# Patient Record
Sex: Male | Born: 1965 | Race: White | Hispanic: No | Marital: Married | Smoking: Never smoker
Health system: Southern US, Community
[De-identification: ages and names within clinical notes are randomized; demographics above are authoritative.]

## PROBLEM LIST (undated history)

## (undated) DIAGNOSIS — I10 Essential (primary) hypertension: Secondary | ICD-10-CM

## (undated) DIAGNOSIS — E119 Type 2 diabetes mellitus without complications: Secondary | ICD-10-CM

## (undated) DIAGNOSIS — B019 Varicella without complication: Secondary | ICD-10-CM

## (undated) DIAGNOSIS — E785 Hyperlipidemia, unspecified: Secondary | ICD-10-CM

## (undated) DIAGNOSIS — T7840XA Allergy, unspecified, initial encounter: Secondary | ICD-10-CM

## (undated) DIAGNOSIS — J45909 Unspecified asthma, uncomplicated: Secondary | ICD-10-CM

## (undated) DIAGNOSIS — N189 Chronic kidney disease, unspecified: Secondary | ICD-10-CM

## (undated) DIAGNOSIS — F419 Anxiety disorder, unspecified: Secondary | ICD-10-CM

## (undated) HISTORY — DX: Anxiety disorder, unspecified: F41.9

## (undated) HISTORY — DX: Allergy, unspecified, initial encounter: T78.40XA

## (undated) HISTORY — PX: COLONOSCOPY: SHX174

## (undated) HISTORY — PX: BACK SURGERY: SHX140

## (undated) HISTORY — DX: Unspecified asthma, uncomplicated: J45.909

## (undated) HISTORY — DX: Essential (primary) hypertension: I10

## (undated) HISTORY — DX: Chronic kidney disease, unspecified: N18.9

## (undated) HISTORY — DX: Hyperlipidemia, unspecified: E78.5

## (undated) HISTORY — DX: Varicella without complication: B01.9

## (undated) HISTORY — DX: Type 2 diabetes mellitus without complications: E11.9

---

## 2009-09-29 ENCOUNTER — Encounter
Admission: RE | Admit: 2009-09-29 | Discharge: 2009-09-29 | Payer: Self-pay | Admitting: Physical Medicine and Rehabilitation

## 2009-10-31 ENCOUNTER — Ambulatory Visit (HOSPITAL_COMMUNITY): Admission: RE | Admit: 2009-10-31 | Discharge: 2009-11-01 | Payer: Self-pay | Admitting: Orthopedic Surgery

## 2010-04-26 ENCOUNTER — Encounter: Admission: RE | Admit: 2010-04-26 | Discharge: 2010-04-26 | Payer: Self-pay | Admitting: Orthopedic Surgery

## 2010-05-24 ENCOUNTER — Inpatient Hospital Stay (HOSPITAL_COMMUNITY): Admission: RE | Admit: 2010-05-24 | Discharge: 2010-05-26 | Payer: Self-pay | Admitting: Orthopedic Surgery

## 2011-01-20 ENCOUNTER — Encounter: Payer: Self-pay | Admitting: Physical Medicine and Rehabilitation

## 2011-03-18 LAB — DIFFERENTIAL
Basophils Absolute: 0 10*3/uL (ref 0.0–0.1)
Basophils Relative: 1 % (ref 0–1)
Eosinophils Relative: 2 % (ref 0–5)
Lymphs Abs: 1.6 10*3/uL (ref 0.7–4.0)
Neutro Abs: 2.1 10*3/uL (ref 1.7–7.7)
Neutrophils Relative %: 53 % (ref 43–77)

## 2011-03-18 LAB — PROTIME-INR: INR: 1 (ref 0.00–1.49)

## 2011-03-18 LAB — COMPREHENSIVE METABOLIC PANEL
ALT: 56 U/L — ABNORMAL HIGH (ref 0–53)
AST: 33 U/L (ref 0–37)
Alkaline Phosphatase: 62 U/L (ref 39–117)
BUN: 19 mg/dL (ref 6–23)
CO2: 31 mEq/L (ref 19–32)
Chloride: 104 mEq/L (ref 96–112)
Creatinine, Ser: 1.28 mg/dL (ref 0.4–1.5)
GFR calc non Af Amer: >60 mL/min (ref >60)
Glucose, Bld: 126 mg/dL — ABNORMAL HIGH (ref 70–99)

## 2011-03-18 LAB — URINALYSIS, ROUTINE W REFLEX MICROSCOPIC
Glucose, UA: NEGATIVE mg/dL
Protein, ur: NEGATIVE mg/dL
Urobilinogen, UA: 0.2 mg/dL (ref 0.0–1.0)
pH: 6 (ref 5.0–8.0)

## 2011-03-18 LAB — CBC
Hemoglobin: 12.3 g/dL — ABNORMAL LOW (ref 13.0–17.0)
MCV: 88.9 fL (ref 78.0–100.0)
Platelets: 156 10*3/uL (ref 150–400)
RBC: 3.98 MIL/uL — ABNORMAL LOW (ref 4.22–5.81)
WBC: 4 10*3/uL (ref 4.0–10.5)

## 2011-03-18 LAB — TYPE AND SCREEN
ABO/RH(D): O POS
Antibody Screen: NEGATIVE

## 2011-04-03 LAB — COMPREHENSIVE METABOLIC PANEL
AST: 23 U/L (ref 0–37)
Alkaline Phosphatase: 63 U/L (ref 39–117)
BUN: 15 mg/dL (ref 6–23)
CO2: 28 mEq/L (ref 19–32)
Calcium: 9.4 mg/dL (ref 8.4–10.5)
Chloride: 107 mEq/L (ref 96–112)
Creatinine, Ser: 1.13 mg/dL (ref 0.4–1.5)
GFR calc Af Amer: >60 mL/min (ref >60)
GFR calc non Af Amer: >60 mL/min (ref >60)
Glucose, Bld: 118 mg/dL — ABNORMAL HIGH (ref 70–99)
Total Protein: 6.7 g/dL (ref 6.0–8.3)

## 2011-04-03 LAB — DIFFERENTIAL
Basophils Absolute: 0 10*3/uL (ref 0.0–0.1)
Basophils Relative: 0 % (ref 0–1)
Eosinophils Absolute: 0.1 10*3/uL (ref 0.0–0.7)
Eosinophils Relative: 2 % (ref 0–5)
Neutrophils Relative %: 55 % (ref 43–77)

## 2011-04-03 LAB — CBC
HCT: 38.4 % — ABNORMAL LOW (ref 39.0–52.0)
Hemoglobin: 13.1 g/dL (ref 13.0–17.0)
Platelets: 144 10*3/uL — ABNORMAL LOW (ref 150–400)
RBC: 4.28 MIL/uL (ref 4.22–5.81)
WBC: 3.6 10*3/uL — ABNORMAL LOW (ref 4.0–10.5)

## 2011-04-03 LAB — URINALYSIS, ROUTINE W REFLEX MICROSCOPIC
Bilirubin Urine: NEGATIVE
Protein, ur: NEGATIVE mg/dL
Urobilinogen, UA: 0.2 mg/dL (ref 0.0–1.0)
pH: 6 (ref 5.0–8.0)

## 2011-04-03 LAB — PROTIME-INR: INR: 0.99 (ref 0.00–1.49)

## 2011-04-03 LAB — TYPE AND SCREEN: Antibody Screen: NEGATIVE

## 2018-03-13 ENCOUNTER — Encounter: Payer: Self-pay | Admitting: Family Medicine

## 2018-03-13 ENCOUNTER — Ambulatory Visit (INDEPENDENT_AMBULATORY_CARE_PROVIDER_SITE_OTHER): Payer: 59 | Admitting: Family Medicine

## 2018-03-13 VITALS — BP 128/80 | HR 70 | Ht 69.0 in | Wt 211.0 lb

## 2018-03-13 DIAGNOSIS — E559 Vitamin D deficiency, unspecified: Secondary | ICD-10-CM

## 2018-03-13 DIAGNOSIS — R5383 Other fatigue: Secondary | ICD-10-CM | POA: Diagnosis not present

## 2018-03-13 DIAGNOSIS — I10 Essential (primary) hypertension: Secondary | ICD-10-CM | POA: Diagnosis not present

## 2018-03-13 DIAGNOSIS — Z0000 Encounter for general adult medical examination without abnormal findings: Secondary | ICD-10-CM

## 2018-03-13 DIAGNOSIS — M1990 Unspecified osteoarthritis, unspecified site: Secondary | ICD-10-CM

## 2018-03-13 DIAGNOSIS — R635 Abnormal weight gain: Secondary | ICD-10-CM | POA: Insufficient documentation

## 2018-03-13 DIAGNOSIS — E7800 Pure hypercholesterolemia, unspecified: Secondary | ICD-10-CM | POA: Insufficient documentation

## 2018-03-13 LAB — URINALYSIS, ROUTINE W REFLEX MICROSCOPIC
Bilirubin Urine: NEGATIVE
Hgb urine dipstick: NEGATIVE
Ketones, ur: NEGATIVE
Leukocytes, UA: NEGATIVE
Nitrite: NEGATIVE
Specific Gravity, Urine: 1.02 (ref 1.000–1.030)
TOTAL PROTEIN, URINE-UPE24: NEGATIVE
URINE GLUCOSE: NEGATIVE
Urobilinogen, UA: 0.2 (ref 0.0–1.0)
pH: 6 (ref 5.0–8.0)

## 2018-03-13 LAB — COMPREHENSIVE METABOLIC PANEL
ALT: 47 U/L (ref 0–53)
AST: 20 U/L (ref 0–37)
Albumin: 4.6 g/dL (ref 3.5–5.2)
Alkaline Phosphatase: 61 U/L (ref 39–117)
BUN: 21 mg/dL (ref 6–23)
CHLORIDE: 103 meq/L (ref 96–112)
CO2: 31 meq/L (ref 19–32)
Calcium: 9.8 mg/dL (ref 8.4–10.5)
Creatinine, Ser: 1.27 mg/dL (ref 0.40–1.50)
GFR: 63.36 mL/min (ref >60.00)
Glucose, Bld: 117 mg/dL — ABNORMAL HIGH (ref 70–99)
POTASSIUM: 3.7 meq/L (ref 3.5–5.1)
SODIUM: 142 meq/L (ref 135–145)
TOTAL PROTEIN: 6.9 g/dL (ref 6.0–8.3)
Total Bilirubin: 0.7 mg/dL (ref 0.2–1.2)

## 2018-03-13 LAB — CBC
HEMATOCRIT: 39.2 % (ref 39.0–52.0)
Hemoglobin: 13.7 g/dL (ref 13.0–17.0)
MCHC: 35 g/dL (ref 30.0–36.0)
MCV: 86.7 fl (ref 78.0–100.0)
Platelets: 171 10*3/uL (ref 150.0–400.0)
RBC: 4.52 Mil/uL (ref 4.22–5.81)
RDW: 12.8 % (ref 11.5–15.5)
WBC: 3.5 10*3/uL — AB (ref 4.0–10.5)

## 2018-03-13 LAB — LIPID PANEL
CHOL/HDL RATIO: 4
Cholesterol: 166 mg/dL (ref 0–200)
HDL: 38.9 mg/dL — AB (ref >39.00)
NONHDL: 127.04
Triglycerides: 203 mg/dL — ABNORMAL HIGH (ref 0.0–149.0)
VLDL: 40.6 mg/dL — AB (ref 0.0–40.0)

## 2018-03-13 LAB — VITAMIN D 25 HYDROXY (VIT D DEFICIENCY, FRACTURES): VITD: 33.82 ng/mL (ref 30.00–100.00)

## 2018-03-13 LAB — PSA: PSA: 1.06 ng/mL (ref 0.10–4.00)

## 2018-03-13 LAB — LDL CHOLESTEROL, DIRECT: Direct LDL: 101 mg/dL

## 2018-03-13 LAB — TSH: TSH: 3.33 u[IU]/mL (ref 0.35–4.50)

## 2018-03-13 MED ORDER — OLMESARTAN MEDOXOMIL-HCTZ 40-25 MG PO TABS: 1.0000 | ORAL_TABLET | Freq: Every day | ORAL | 1 refills | Status: DC

## 2018-03-13 NOTE — Progress Notes (Addendum)
Subjective:  Patient ID: Henry Hernandez, male    DOB: [birth year]  Age: 52 y.o. MRN: [id number]  CC: Establish Care   HPI Henry SomJames D Krauter presents for follow-up of his hypertension, elevated cholesterol and vitamin D deficiency.  Since our last meeting he has gained some weight and developed some fatigue and swelling in his ankles.  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  He is exercising some but not as much as he would like to.  Recent colonoscopy was good but they did bring him an internal hemorrhoid.  He is having no troubles with his medicines.  His blood pressure and cholesterol have been well controlled.  His blood glucose levels have been elevated but recent hemoglobin A1c checks have been normal.  He does not smoke drink or use illicit drugs.  He does have some urinary hesitancy and nocturia but the symptoms are intermittent and do not bother him on a consistent basis.  Outpatient Medications Prior to Visit  Medication Sig Dispense Refill  . aspirin EC 81 MG tablet Take 1 tablet by mouth daily.    . indomethacin (INDOCIN) 25 MG capsule Take 1 capsule by mouth 3 (three) times daily with meals.  1  . atorvastatin (LIPITOR) 20 MG tablet Take 1 tablet by mouth daily.  0  . metoprolol succinate (TOPROL-XL) 25 MG 24 hr tablet Take 1 tablet by mouth daily.  0  . olmesartan-hydrochlorothiazide (BENICAR HCT) 40-12.5 MG tablet Take 1 tablet by mouth daily.  0  . Vitamin D, Ergocalciferol, (DRISDOL) 50000 units CAPS capsule Take 1 capsule by mouth once a week.     No facility-administered medications prior to visit.     ROS Review of Systems  Constitutional: Positive for fatigue. Negative for chills and fever.  HENT: Positive for postnasal drip and rhinorrhea.   Eyes: Negative for photophobia and visual disturbance.  Respiratory: Negative.   Cardiovascular: Negative.   Gastrointestinal: Negative.   Endocrine: Negative for polyphagia and polyuria.    Genitourinary: Negative for decreased urine volume, hematuria and urgency.  Musculoskeletal: Negative for arthralgias.  Skin: Negative for pallor and rash.  Allergic/Immunologic: Negative for immunocompromised state.  Neurological: Negative for weakness and headaches.  Hematological: Does not bruise/bleed easily.  Psychiatric/Behavioral: Negative.     Objective:  BP 128/80 (BP Location: Left Arm, Patient Position: Sitting, Cuff Size: Normal)   Pulse 70   Ht 5\' 9"  (1.753 m)   Wt 211 lb (95.7 kg)   SpO2 99%   BMI 31.16 kg/m   BP Readings from Last 3 Encounters:  03/13/18 128/80    Wt Readings from Last 3 Encounters:  03/13/18 211 lb (95.7 kg)    Physical Exam  Constitutional: He is oriented to person, place, and time. He appears well-developed and well-nourished. No distress.  HENT:  Head: Normocephalic and atraumatic.  Right Ear: External ear normal.  Left Ear: External ear normal.  Mouth/Throat: Oropharynx is clear and moist. No oropharyngeal exudate.  Eyes: Conjunctivae are normal. Pupils are equal, round, and reactive to light. Right eye exhibits no discharge. Left eye exhibits no discharge. No scleral icterus.  Neck: Neck supple. No JVD present. No tracheal deviation present. No thyromegaly present.  Cardiovascular: Normal rate, regular rhythm and normal heart sounds.  Pulmonary/Chest: Effort normal and breath sounds normal. No stridor.  Abdominal: Soft. Bowel sounds are normal. He exhibits no distension. There is no tenderness. There is no rebound and no guarding.  Genitourinary: Rectal exam shows no external hemorrhoid, no internal hemorrhoid, no fissure, no mass, no tenderness, anal tone normal and guaiac negative stool. Prostate is not enlarged and not tender.  Musculoskeletal:       Right lower leg: He exhibits no edema.       Left lower leg: He exhibits no edema.  Lymphadenopathy:    He has no cervical adenopathy.  Neurological: He is alert and oriented to  person, place, and time.  Skin: Skin is warm and dry. No rash noted. He is not diaphoretic.  Psychiatric: He has a normal mood and affect. His behavior is normal. Thought content normal.    Lab Results  Component Value Date   WBC 3.5 (L) 03/13/2018   HGB 13.7 03/13/2018   HCT 39.2 03/13/2018   PLT 171.0 03/13/2018   GLUCOSE 117 (H) 03/13/2018   CHOL 166 03/13/2018   TRIG 203.0 (H) 03/13/2018   HDL 38.90 (L) 03/13/2018   LDLDIRECT 101.0 03/13/2018   ALT 47 03/13/2018   AST 20 03/13/2018   NA 142 03/13/2018   K 3.7 03/13/2018   CL 103 03/13/2018   CREATININE 1.27 03/13/2018   BUN 21 03/13/2018   CO2 31 03/13/2018   TSH 3.33 03/13/2018   PSA 1.06 03/13/2018   INR 1.00 05/17/2010    Dg Or Portable Spine  Result Date: 05/24/2010 Clinical Data: L5 S1 disc protrusion.  PORTABLE SPINE  Comparison: 05/24/2010 at the 1:15 p.m.  Findings: Lumbar probes are present at the L5-S1 level.  IMPRESSION:  1.  Probe localization of the L5-S1 intervertebral level. Provider: Burnis Medin  Dg Or Portable Spine  Result Date: 05/24/2010 Clinical Data: Disc protrusion, intraoperative evaluation.  PORTABLE SPINE  Comparison: Prior exams including 05/24/2010, 05/17/2010, and 04/03/2010  Findings: The blunt-tipped probe extends below the L5 spinous process, at the L5-S1 level.  IMPRESSION:  1.  L5-S1 localization. Provider: Burnis Medin  Dg Or Portable Spine  Result Date: 05/24/2010 Clinical Data: Recurrent disc protrusion at the L5-S1 can  PORTABLE SPINE  Comparison: Lumbar radiographs from 05/17/2010; lumbar MRI from 04/26/2010  Findings: Two needle type probes are present.  One is oriented between the L4 and L5 spinous processes.  The other is oriented between the L5 and S1 spinous processes.  IMPRESSION:  1.  Upper needle and L4-5; lower needle L5-S1. Provider: Burnis Medin   Assessment & Plan:   Harol was seen today for establish care.  Diagnoses and all orders for this  visit:  Essential hypertension -     CBC -     Comprehensive metabolic panel -     TSH -     Urinalysis, Routine w reflex microscopic -     Discontinue: olmesartan-hydrochlorothiazide (BENICAR HCT) 40-25 MG tablet; Take 1 tablet by mouth daily. -     metoprolol succinate (TOPROL-XL) 25 MG 24 hr tablet; Take 1 tablet (25 mg total) by mouth daily. -     olmesartan-hydrochlorothiazide (BENICAR HCT) 40-25 MG tablet; Take 1 tablet by mouth daily.  Elevated cholesterol -     Comprehensive metabolic panel -     Lipid panel -     atorvastatin (LIPITOR) 20 MG tablet; Take 1 tablet (20 mg total) by mouth daily.  Weight gain -     TSH  Fatigue, unspecified type -     CBC -     Comprehensive metabolic panel -     TSH  Vitamin D deficiency -     VITAMIN D 25  Hydroxy (Vit-D Deficiency, Fractures) -     Vitamin D, Ergocalciferol, (DRISDOL) 50000 units CAPS capsule; Take 1 capsule (50,000 Units total) by mouth once a week.  Health care maintenance -     PSA  Other orders -     LDL cholesterol, direct   I have changed Quita Skye. Hellums's atorvastatin, metoprolol succinate, and Vitamin D (Ergocalciferol). I am also having him maintain his indomethacin, aspirin EC, and olmesartan-hydrochlorothiazide.  Meds ordered this encounter  Medications  . DISCONTD: olmesartan-hydrochlorothiazide (BENICAR HCT) 40-25 MG tablet    Sig: Take 1 tablet by mouth daily.    Dispense:  100 tablet    Refill:  1  . atorvastatin (LIPITOR) 20 MG tablet    Sig: Take 1 tablet (20 mg total) by mouth daily.    Dispense:  100 tablet    Refill:  1  . metoprolol succinate (TOPROL-XL) 25 MG 24 hr tablet    Sig: Take 1 tablet (25 mg total) by mouth daily.    Dispense:  100 tablet    Refill:  1  . Vitamin D, Ergocalciferol, (DRISDOL) 50000 units CAPS capsule    Sig: Take 1 capsule (50,000 Units total) by mouth once a week.    Dispense:  30 capsule    Refill:  0  . olmesartan-hydrochlorothiazide (BENICAR HCT) 40-25 MG  tablet    Sig: Take 1 tablet by mouth daily.    Dispense:  100 tablet    Refill:  1   Will increase his olmesartan/HCTZ to 40/25 mg.  He will check his blood pressures and let me know if they run too low.  I believe this may help some with his swelling.  Discussed likely etiology for his ankle swelling to be has increased weight and standing for his job.  He is going to make a concerted effort to lose some weight and increase his exercise.  Follow-up: No Follow-up on file.  Mliss Sax, MD

## 2018-03-16 MED ORDER — OLMESARTAN MEDOXOMIL-HCTZ 40-25 MG PO TABS: 1.0000 | ORAL_TABLET | Freq: Every day | ORAL | 1 refills | Status: DC

## 2018-03-16 MED ORDER — VITAMIN D (ERGOCALCIFEROL) 1.25 MG (50000 UNIT) PO CAPS: 50000.0000 [IU] | ORAL_CAPSULE | ORAL | 0 refills | Status: DC

## 2018-03-16 MED ORDER — METOPROLOL SUCCINATE ER 25 MG PO TB24: 25.0000 mg | ORAL_TABLET | Freq: Every day | ORAL | 1 refills | Status: DC

## 2018-03-16 MED ORDER — ATORVASTATIN CALCIUM 20 MG PO TABS: 20.0000 mg | ORAL_TABLET | Freq: Every day | ORAL | 1 refills | Status: DC

## 2018-03-17 ENCOUNTER — Encounter: Payer: Self-pay | Admitting: Family Medicine

## 2018-07-01 ENCOUNTER — Other Ambulatory Visit: Payer: Self-pay | Admitting: Family Medicine

## 2018-07-01 DIAGNOSIS — M1990 Unspecified osteoarthritis, unspecified site: Secondary | ICD-10-CM

## 2018-09-02 ENCOUNTER — Other Ambulatory Visit: Payer: Self-pay | Admitting: Family Medicine

## 2018-09-02 DIAGNOSIS — I10 Essential (primary) hypertension: Secondary | ICD-10-CM

## 2018-09-02 DIAGNOSIS — E7800 Pure hypercholesterolemia, unspecified: Secondary | ICD-10-CM

## 2018-11-30 ENCOUNTER — Encounter: Payer: Self-pay | Admitting: Family Medicine

## 2018-11-30 ENCOUNTER — Ambulatory Visit (INDEPENDENT_AMBULATORY_CARE_PROVIDER_SITE_OTHER): Payer: 59 | Admitting: Family Medicine

## 2018-11-30 VITALS — BP 120/70 | HR 98 | Temp 99.5°F | Ht 69.0 in | Wt 209.2 lb

## 2018-11-30 DIAGNOSIS — M7021 Olecranon bursitis, right elbow: Secondary | ICD-10-CM | POA: Insufficient documentation

## 2018-11-30 DIAGNOSIS — I10 Essential (primary) hypertension: Secondary | ICD-10-CM | POA: Diagnosis not present

## 2018-11-30 DIAGNOSIS — E7800 Pure hypercholesterolemia, unspecified: Secondary | ICD-10-CM | POA: Diagnosis not present

## 2018-11-30 DIAGNOSIS — E559 Vitamin D deficiency, unspecified: Secondary | ICD-10-CM

## 2018-11-30 LAB — URINALYSIS, ROUTINE W REFLEX MICROSCOPIC
Bilirubin Urine: NEGATIVE
Hgb urine dipstick: NEGATIVE
Ketones, ur: NEGATIVE
Leukocytes, UA: NEGATIVE
Nitrite: NEGATIVE
PH: 6 (ref 5.0–8.0)
SPECIFIC GRAVITY, URINE: 1.015 (ref 1.000–1.030)
Total Protein, Urine: NEGATIVE
URINE GLUCOSE: NEGATIVE
UROBILINOGEN UA: 0.2 (ref 0.0–1.0)

## 2018-11-30 LAB — COMPREHENSIVE METABOLIC PANEL
ALT: 42 U/L (ref 0–53)
AST: 17 U/L (ref 0–37)
Albumin: 4.6 g/dL (ref 3.5–5.2)
Alkaline Phosphatase: 68 U/L (ref 39–117)
BILIRUBIN TOTAL: 0.9 mg/dL (ref 0.2–1.2)
BUN: 17 mg/dL (ref 6–23)
CALCIUM: 9.4 mg/dL (ref 8.4–10.5)
CHLORIDE: 100 meq/L (ref 96–112)
CO2: 31 mEq/L (ref 19–32)
Creatinine, Ser: 1.24 mg/dL (ref 0.40–1.50)
GFR: 64.95 mL/min (ref >60.00)
Glucose, Bld: 116 mg/dL — ABNORMAL HIGH (ref 70–99)
POTASSIUM: 4 meq/L (ref 3.5–5.1)
Sodium: 141 mEq/L (ref 135–145)
Total Protein: 7.4 g/dL (ref 6.0–8.3)

## 2018-11-30 LAB — LIPID PANEL
CHOLESTEROL: 154 mg/dL (ref 0–200)
HDL: 34.2 mg/dL — AB (ref >39.00)
LDL Cholesterol: 85 mg/dL (ref 0–99)
NonHDL: 119.78
TRIGLYCERIDES: 175 mg/dL — AB (ref 0.0–149.0)
Total CHOL/HDL Ratio: 5
VLDL: 35 mg/dL (ref 0.0–40.0)

## 2018-11-30 LAB — CBC
HEMATOCRIT: 36.8 % — AB (ref 39.0–52.0)
HEMOGLOBIN: 12.8 g/dL — AB (ref 13.0–17.0)
MCHC: 34.9 g/dL (ref 30.0–36.0)
MCV: 86.3 fl (ref 78.0–100.0)
PLATELETS: 199 10*3/uL (ref 150.0–400.0)
RBC: 4.26 Mil/uL (ref 4.22–5.81)
RDW: 12.9 % (ref 11.5–15.5)
WBC: 5.4 10*3/uL (ref 4.0–10.5)

## 2018-11-30 LAB — URIC ACID: URIC ACID, SERUM: 8.8 mg/dL — AB (ref 4.0–7.8)

## 2018-11-30 LAB — VITAMIN D 25 HYDROXY (VIT D DEFICIENCY, FRACTURES): VITD: 44.57 ng/mL (ref 30.00–100.00)

## 2018-11-30 LAB — LDL CHOLESTEROL, DIRECT: Direct LDL: 92 mg/dL

## 2018-11-30 MED ORDER — OLMESARTAN MEDOXOMIL-HCTZ 40-25 MG PO TABS: 1.0000 | ORAL_TABLET | Freq: Every day | ORAL | 1 refills | Status: DC

## 2018-11-30 MED ORDER — MELOXICAM 15 MG PO TABS: 15.0000 mg | ORAL_TABLET | Freq: Every day | ORAL | 0 refills | Status: DC

## 2018-11-30 MED ORDER — VITAMIN D (ERGOCALCIFEROL) 1.25 MG (50000 UNIT) PO CAPS: 50000.0000 [IU] | ORAL_CAPSULE | ORAL | 0 refills | Status: DC

## 2018-11-30 MED ORDER — ATORVASTATIN CALCIUM 20 MG PO TABS: 20.0000 mg | ORAL_TABLET | Freq: Every day | ORAL | 1 refills | Status: DC

## 2018-11-30 MED ORDER — METOPROLOL SUCCINATE ER 25 MG PO TB24: 25.0000 mg | ORAL_TABLET | Freq: Every day | ORAL | 1 refills | Status: DC

## 2018-11-30 NOTE — Progress Notes (Signed)
Established Patient Office Visit  Subjective:  Patient ID: Henry Hernandez, male    DOB: May 02, 1966  Age: 52 y.o. MRN: [id number]  CC:  Chief Complaint  Patient presents with  . Elbow Pain    HPI Henry Hernandez presents for evaluation of redness and swelling of his right elbow. It is painful and the pain goes down to his hand, there is also some redness on his arm. He had a gout attack a long time ago.  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  There is been no injury per se to his elbow.  He does have a history of gout.  He had taken Indocin in the past for his gout and that upset his stomach he has no history of diabetes.  There is been no fever or chills with this.  Blood pressure has been well controlled with the current metoprolol.  He is having no issues with these medicines.  He is having no issues taking atorvastatin.  Past Medical History:  Diagnosis Date  . Allergy   . Chicken pox   . Hyperlipidemia   . Hypertension     Past Surgical History:  Procedure Laterality Date  . BACK SURGERY      Family History  Problem Relation Age of Onset  . Arthritis Mother   . COPD Mother   . Diabetes Mother   . Heart attack Mother   . Heart disease Mother   . Hyperlipidemia Mother   . Hypertension Mother   . Kidney disease Mother   . Stroke Mother   . Hypertension Father   . Hypertension Sister   . Hypertension Brother   . Cancer Maternal Grandmother   . Hypertension Maternal Grandmother     Social History   Socioeconomic History  . Marital status: Married    Spouse name: Not on file  . Number of children: Not on file  . Years of education: Not on file  . Highest education level: Not on file  Occupational History  . Not on file  Social Needs  . Financial resource strain: Not on file  . Food insecurity:    Worry: Not on file    Inability: Not on file  . Transportation needs:    Medical: Not on file    Non-medical: Not on file  Tobacco Use  .  Smoking status: Never Smoker  . Smokeless tobacco: Never Used  Substance and Sexual Activity  . Alcohol use: Not on file  . Drug use: Not on file  . Sexual activity: Not on file  Lifestyle  . Physical activity:    Days per week: Not on file    Minutes per session: Not on file  . Stress: Not on file  Relationships  . Social connections:    Talks on phone: Not on file    Gets together: Not on file    Attends religious service: Not on file    Active member of club or organization: Not on file    Attends meetings of clubs or organizations: Not on file    Relationship status: Not on file  . Intimate partner violence:    Fear of current or ex partner: Not on file    Emotionally abused: Not on file    Physically abused: Not on file    Forced sexual activity: Not on file  Other Topics Concern  . Not on file  Social History Narrative  . Not on file    Outpatient  Medications Prior to Visit  Medication Sig Dispense Refill  . aspirin EC 81 MG tablet Take 1 tablet by mouth daily.    [name]. atorvastatin (LIPITOR) 20 MG tablet TAKE 1 TABLET BY MOUTH EVERY DAY 90 tablet 1  . indomethacin (INDOCIN) 25 MG capsule Take one only as needed with food every 8 hours for joint pains. 90 capsule 1  . metoprolol succinate (TOPROL-XL) 25 MG 24 hr tablet Take 1 tablet (25 mg total) by mouth daily. 100 tablet 1  . olmesartan-hydrochlorothiazide (BENICAR HCT) 40-25 MG tablet TAKE 1 TABLET BY MOUTH EVERY DAY 90 tablet 1  . Vitamin D, Ergocalciferol, (DRISDOL) 50000 units CAPS capsule Take 1 capsule (50,000 Units total) by mouth once a week. 30 capsule 0   No facility-administered medications prior to visit.     No Known Allergies  ROS Review of Systems  Constitutional: Negative.   HENT: Negative.   Eyes: Negative for photophobia and visual disturbance.  Respiratory: Negative.   Cardiovascular: Negative.   Gastrointestinal: Negative.   Endocrine: Negative for polyphagia and polyuria.  Musculoskeletal:  Positive for arthralgias and joint swelling.  Skin: Positive for color change. Negative for wound.  Allergic/Immunologic: Negative for immunocompromised state.  Neurological: Negative for weakness and numbness.  Hematological: Does not bruise/bleed easily.  Psychiatric/Behavioral: Negative.       Objective:    Physical Exam  Constitutional: He is oriented to person, place, and time. He appears well-developed and well-nourished. No distress.  HENT:  Head: Normocephalic and atraumatic.  Right Ear: External ear normal.  Left Ear: External ear normal.  Eyes: Right eye exhibits no discharge. Left eye exhibits no discharge. No scleral icterus.  Neck: No JVD present. No tracheal deviation present.  Pulmonary/Chest: Effort normal.  Musculoskeletal:       Right elbow: He exhibits decreased range of motion and swelling. He exhibits no effusion and no deformity. Tenderness found. Olecranon process tenderness noted.       Arms: Neurological: He is alert and oriented to person, place, and time.  Skin: Skin is warm and dry. He is not diaphoretic. There is erythema.  Psychiatric: He has a normal mood and affect. His behavior is normal.    BP 120/70   Pulse 98   Temp 99.5 F (37.5 C) (Oral)   Ht 5\' 9"  (1.753 m)   Wt 209 lb 4 oz (94.9 kg)   SpO2 99%   BMI 30.90 kg/m  Wt Readings from Last 3 Encounters:  11/30/18 209 lb 4 oz (94.9 kg)  03/13/18 211 lb (95.7 kg)   BP Readings from Last 3 Encounters:  11/30/18 120/70  03/13/18 128/80   Health Maintenance Due  Topic Date Due  . HIV Screening  06/15/1981  . TETANUS/TDAP  06/15/1985  . INFLUENZA VACCINE  07/30/2018    There are no preventive care reminders to display for this patient.  Lab Results  Component Value Date   TSH 3.33 03/13/2018   Lab Results  Component Value Date   WBC 3.5 (L) 03/13/2018   HGB 13.7 03/13/2018   HCT 39.2 03/13/2018   MCV 86.7 03/13/2018   PLT 171.0 03/13/2018   Lab Results  Component Value Date    NA 142 03/13/2018   K 3.7 03/13/2018   CO2 31 03/13/2018   GLUCOSE 117 (H) 03/13/2018   BUN 21 03/13/2018   CREATININE 1.27 03/13/2018   BILITOT 0.7 03/13/2018   ALKPHOS 61 03/13/2018   AST 20 03/13/2018   ALT 47 03/13/2018  PROT 6.9 03/13/2018   ALBUMIN 4.6 03/13/2018   CALCIUM 9.8 03/13/2018   GFR 63.36 03/13/2018   Lab Results  Component Value Date   CHOL 166 03/13/2018   Lab Results  Component Value Date   HDL 38.90 (L) 03/13/2018   No results found for: [facility] Lab Results  Component Value Date   TRIG 203.0 (H) 03/13/2018   Lab Results  Component Value Date   CHOLHDL 4 03/13/2018   No results found for: HGBA1C    Assessment & Plan:   Problem List Items Addressed This Visit      Cardiovascular and Mediastinum   Essential hypertension - Primary   Relevant Medications   atorvastatin (LIPITOR) 20 MG tablet   metoprolol succinate (TOPROL-XL) 25 MG 24 hr tablet   olmesartan-hydrochlorothiazide (BENICAR HCT) 40-25 MG tablet   Other Relevant Orders   CBC   Comprehensive metabolic panel   Urinalysis, Routine w reflex microscopic     Musculoskeletal and Integument   Olecranon bursitis of right elbow   Relevant Medications   meloxicam (MOBIC) 15 MG tablet   Other Relevant Orders   CBC   Uric acid     Other   Elevated cholesterol   Relevant Medications   atorvastatin (LIPITOR) 20 MG tablet   metoprolol succinate (TOPROL-XL) 25 MG 24 hr tablet   olmesartan-hydrochlorothiazide (BENICAR HCT) 40-25 MG tablet   Other Relevant Orders   Comprehensive metabolic panel   LDL cholesterol, direct   Lipid panel   Vitamin D deficiency   Relevant Medications   Vitamin D, Ergocalciferol, (DRISDOL) 1.25 MG (50000 UT) CAPS capsule   Other Relevant Orders   VITAMIN D 25 Hydroxy (Vit-D Deficiency, Fractures)      Meds ordered this encounter  Medications  . atorvastatin (LIPITOR) 20 MG tablet    Sig: Take 1 tablet (20 mg total) by mouth daily.    Dispense:  90  tablet    Refill:  1    Due for follow up this month  . metoprolol succinate (TOPROL-XL) 25 MG 24 hr tablet    Sig: Take 1 tablet (25 mg total) by mouth daily.    Dispense:  100 tablet    Refill:  1  . olmesartan-hydrochlorothiazide (BENICAR HCT) 40-25 MG tablet    Sig: Take 1 tablet by mouth daily.    Dispense:  90 tablet    Refill:  1    Due for follow up this month  . Vitamin D, Ergocalciferol, (DRISDOL) 1.25 MG (50000 UT) CAPS capsule    Sig: Take 1 capsule (50,000 Units total) by mouth once a week.    Dispense:  30 capsule    Refill:  0  . meloxicam (MOBIC) 15 MG tablet    Sig: Take 1 tablet (15 mg total) by mouth daily. For 10 days and then as needed with food.    Dispense:  30 tablet    Refill:  0   Patient was given anticipatory guidance on olecranon bursitis.  He will apply a neoprene sleeve and take meloxicam for 10 days.  He will follow-up if it does not resolve with this treatment. Follow-up: Return in about 6 months (around 06/01/2019), or if symptoms worsen or fail to improve.

## 2018-11-30 NOTE — Patient Instructions (Signed)
Elbow Bursitis Elbow bursitis is inflammation of the fluid-filled sac (bursa) between the tip of your elbow bone (olecranon) and your skin. Elbow bursitis may also be called olecranon bursitis. Normally, the olecranon bursa has only a small amount of fluid in it to cushion and protect your elbow bone. Elbow bursitis causes fluid to build up inside the bursa. Over time, this swelling and inflammation can cause pain when you bend or lean on your elbow. What are the causes? Elbow bursitis may be caused by:  Elbow injury (acute trauma).  Leaning on hard surfaces for long periods of time.  Infection from an injury that breaks the skin near your elbow.  A bone growth (spur) that forms at the tip of your elbow.  A medical condition that causes inflammation in your body, such as gout or rheumatoid arthritis.  The cause may also be unknown. What are the signs or symptoms? The first sign of elbow bursitis is usually swelling over the tip of your elbow. This can grow to be the size of a golf ball. This may start suddenly or develop gradually. You may also have:  Pain when bending or leaning on your elbow.  Restricted movement of your elbow.  If your bursitis is caused by an infection, symptoms may also include:  Redness, warmth, and tenderness of the elbow.  Drainage of pus from the swollen area over your elbow, if the skin breaks open.  How is this diagnosed? Your health care provider may be able to diagnose elbow bursitis based on your signs and symptoms, especially if you have recently been injured. Your health care provider will also do a physical exam. This may include:  X-rays to look for a bone spur or a bone fracture.  Draining fluid from the bursa to test it for infection.  Blood tests to rule out gout or rheumatoid arthritis.  How is this treated? Treatment for elbow bursitis depends on the cause. Treatment may include:  Medicines. These may include: ? Over-the-counter  medicines to relieve pain and inflammation. ? Antibiotic medicines to fight infection. ? Injections of anti-inflammatory medicines (steroids).  Wrapping your elbow with a bandage.  Draining fluid from the bursa.  Wearing elbow pads.  If your bursitis does not get better with treatment, surgery may be needed to remove the bursa. Follow these instructions at home:  Take medicines only as directed by your health care provider.  If you were prescribed an antibiotic medicine, finish all of it even if you start to feel better.  If your bursitis is caused by an injury, rest your elbow and wear your bandage as directed by your health care provider. You may alsoapply ice to the injured area as directed by your health care provider: ? Put ice in a plastic bag. ? Place a towel between your skin and the bag. ? Leave the ice on for 20 minutes, 2-3 times per day.  Avoid any activities that cause elbow pain.  Use elbow pads or elbow wraps to cushion your elbow. Contact a health care provider if:  You have a fever.  Your symptoms do not get better with treatment.  Your pain or swelling gets worse.  Your elbow pain or swelling goes away and then returns.  You have drainage of pus from the swollen area over your elbow. This information is not intended to replace advice given to you by your health care provider. Make sure you discuss any questions you have with your health care provider. Document Released:   01/15/2007 Document Revised: 05/23/2016 Document Reviewed: 08/24/2014 Elsevier Interactive Patient Education  2018 [employer]  

## 2018-12-07 ENCOUNTER — Telehealth: Payer: Self-pay | Admitting: Family Medicine

## 2018-12-07 NOTE — Telephone Encounter (Signed)
Copied from CRM [id number]. Topic: General - Other >> Dec 07, 2018  4:34 PM Lynne LoganHudson, Caryn D wrote: Reason for CRM: Pt called to get lab results. NT unavailable. Please advise

## 2018-12-08 NOTE — Telephone Encounter (Signed)
Charted in result notes. 

## 2018-12-15 ENCOUNTER — Ambulatory Visit (INDEPENDENT_AMBULATORY_CARE_PROVIDER_SITE_OTHER): Payer: 59 | Admitting: Family Medicine

## 2018-12-15 ENCOUNTER — Encounter: Payer: Self-pay | Admitting: Family Medicine

## 2018-12-15 VITALS — BP 124/76 | HR 88 | Ht 69.0 in

## 2018-12-15 DIAGNOSIS — M7021 Olecranon bursitis, right elbow: Secondary | ICD-10-CM | POA: Diagnosis not present

## 2018-12-15 DIAGNOSIS — J4531 Mild persistent asthma with (acute) exacerbation: Secondary | ICD-10-CM | POA: Diagnosis not present

## 2018-12-15 MED ORDER — HYDROCODONE-HOMATROPINE 5-1.5 MG/5ML PO SYRP
5.0000 mL | ORAL_SOLUTION | Freq: Three times a day (TID) | ORAL | 0 refills | Status: DC | PRN
Start: 2018-12-15 — End: 2019-05-25

## 2018-12-15 MED ORDER — PREDNISONE 20 MG PO TABS: 20.0000 mg | ORAL_TABLET | Freq: Two times a day (BID) | ORAL | 0 refills | Status: AC

## 2018-12-15 NOTE — Progress Notes (Signed)
Established Patient Office Visit  Subjective:  Patient ID: Henry Hernandez, male    DOB: 08-16-1966  Age: 52 y.o. MRN: [id number]  CC: No chief complaint on file.   HPI Henry SomJames D Morning presents for evaluation of a cough that persists after URI that is finally seem to have cleared for the most part.  He is no longer running fevers or chills but has been experiencing a dry cough that seems to be worse at night.  There is been some tightness in the chest with the cough but no frank wheezing.  He has no history of asthma or tobacco use.  He has been having discomfort in the left upper chest area.  He experiences this with deep inspiration and expiration.  There is no exertional component to this.  It is not associated with shortness of breath diaphoresis or nausea or vomiting.  He has tried multiple over-the-counter cough and cold remedies.  There is no facial pressure or teeth pain or purulent rhinorrhea.  Right elbow seems to be improving with the meloxicam.  There seems to be some residual decreased range of motion.  Past Medical History:  Diagnosis Date  . Allergy   . Chicken pox   . Hyperlipidemia   . Hypertension     Past Surgical History:  Procedure Laterality Date  . BACK SURGERY      Family History  Problem Relation Age of Onset  . Arthritis Mother   . COPD Mother   . Diabetes Mother   . Heart attack Mother   . Heart disease Mother   . Hyperlipidemia Mother   . Hypertension Mother   . Kidney disease Mother   . Stroke Mother   . Hypertension Father   . Hypertension Sister   . Hypertension Brother   . Cancer Maternal Grandmother   . Hypertension Maternal Grandmother     Social History   Socioeconomic History  . Marital status: Married    Spouse name: Not on file  . Number of children: Not on file  . Years of education: Not on file  . Highest education level: Not on file  Occupational History  . Not on file  Social Needs  . Financial resource strain: Not on file    . Food insecurity:    Worry: Not on file    Inability: Not on file  . Transportation needs:    Medical: Not on file    Non-medical: Not on file  Tobacco Use  . Smoking status: Never Smoker  . Smokeless tobacco: Never Used  Substance and Sexual Activity  . Alcohol use: Not on file  . Drug use: Not on file  . Sexual activity: Not on file  Lifestyle  . Physical activity:    Days per week: Not on file    Minutes per session: Not on file  . Stress: Not on file  Relationships  . Social connections:    Talks on phone: Not on file    Gets together: Not on file    Attends religious service: Not on file    Active member of club or organization: Not on file    Attends meetings of clubs or organizations: Not on file    Relationship status: Not on file  . Intimate partner violence:    Fear of current or ex partner: Not on file    Emotionally abused: Not on file    Physically abused: Not on file    Forced sexual activity: Not on file  Other Topics Concern  . Not on file  Social History Narrative  . Not on file    Outpatient Medications Prior to Visit  Medication Sig Dispense Refill  . aspirin EC 81 MG tablet Take 1 tablet by mouth daily.    [name] atorvastatin (LIPITOR) 20 MG tablet Take 1 tablet (20 mg total) by mouth daily. 90 tablet 1  . meloxicam (MOBIC) 15 MG tablet Take 1 tablet (15 mg total) by mouth daily. For 10 days and then as needed with food. 30 tablet 0  . metoprolol succinate (TOPROL-XL) 25 MG 24 hr tablet Take 1 tablet (25 mg total) by mouth daily. 100 tablet 1  . olmesartan-hydrochlorothiazide (BENICAR HCT) 40-25 MG tablet Take 1 tablet by mouth daily. 90 tablet 1  . Vitamin D, Ergocalciferol, (DRISDOL) 1.25 MG (50000 UT) CAPS capsule Take 1 capsule (50,000 Units total) by mouth once a week. 30 capsule 0   No facility-administered medications prior to visit.     No Known Allergies  ROS Review of Systems  Constitutional: Negative for diaphoresis, fatigue, fever and  unexpected weight change.  HENT: Positive for postnasal drip. Negative for congestion, dental problem, rhinorrhea, sinus pressure and sinus pain.   Eyes: Negative for photophobia and visual disturbance.  Respiratory: Positive for cough. Negative for chest tightness, shortness of breath and wheezing.   Cardiovascular: Positive for chest pain. Negative for leg swelling.  Gastrointestinal: Negative.   Musculoskeletal: Positive for arthralgias. Negative for joint swelling and myalgias.  Skin: Negative for color change, pallor and rash.  Allergic/Immunologic: Negative for immunocompromised state.  Neurological: Negative for light-headedness and headaches.  Hematological: Does not bruise/bleed easily.  Psychiatric/Behavioral: Negative.       Objective:    Physical Exam  Constitutional: He is oriented to person, place, and time. He appears well-developed and well-nourished. No distress.  HENT:  Head: Normocephalic and atraumatic.  Right Ear: External ear normal.  Left Ear: External ear normal.  Mouth/Throat: Oropharynx is clear and moist. No oropharyngeal exudate.  Eyes: Pupils are equal, round, and reactive to light. Conjunctivae are normal. Right eye exhibits no discharge. Left eye exhibits no discharge. No scleral icterus.  Neck: Neck supple. No JVD present. No tracheal deviation present. No thyromegaly present.  Cardiovascular: Normal rate, regular rhythm and normal heart sounds.  Pulmonary/Chest: Effort normal and breath sounds normal. No stridor. No respiratory distress. He has no wheezes. He has no rales.  Abdominal: Bowel sounds are normal.  Musculoskeletal:       Arms:  Lymphadenopathy:    He has no cervical adenopathy.  Neurological: He is alert and oriented to person, place, and time.  Skin: Skin is warm and dry. He is not diaphoretic.  Psychiatric: He has a normal mood and affect.    BP 124/76   Pulse 88   Ht 5\' 9"  (1.753 m)   SpO2 99%   BMI 30.90 kg/m  Wt Readings  from Last 3 Encounters:  11/30/18 209 lb 4 oz (94.9 kg)  03/13/18 211 lb (95.7 kg)   BP Readings from Last 3 Encounters:  12/15/18 124/76  11/30/18 120/70  03/13/18 128/80   Guideline developer:  UpToDate (see UpToDate for funding source) Date Released: June 2014  Health Maintenance Due  Topic Date Due  . HIV Screening  06/15/1981  . TETANUS/TDAP  06/15/1985  . INFLUENZA VACCINE  07/30/2018    There are no preventive care reminders to display for this patient.  Lab Results  Component Value Date   TSH  3.33 03/13/2018   Lab Results  Component Value Date   WBC 5.4 11/30/2018   HGB 12.8 (L) 11/30/2018   HCT 36.8 (L) 11/30/2018   MCV 86.3 11/30/2018   PLT 199.0 11/30/2018   Lab Results  Component Value Date   NA 141 11/30/2018   K 4.0 11/30/2018   CO2 31 11/30/2018   GLUCOSE 116 (H) 11/30/2018   BUN 17 11/30/2018   CREATININE 1.24 11/30/2018   BILITOT 0.9 11/30/2018   ALKPHOS 68 11/30/2018   AST 17 11/30/2018   ALT 42 11/30/2018   PROT 7.4 11/30/2018   ALBUMIN 4.6 11/30/2018   CALCIUM 9.4 11/30/2018   GFR 64.95 11/30/2018   Lab Results  Component Value Date   CHOL 154 11/30/2018   Lab Results  Component Value Date   HDL 34.20 (L) 11/30/2018   Lab Results  Component Value Date   LDLCALC 85 11/30/2018   Lab Results  Component Value Date   TRIG 175.0 (H) 11/30/2018   Lab Results  Component Value Date   CHOLHDL 5 11/30/2018   No results found for: HGBA1C    Assessment & Plan:   Problem List Items Addressed This Visit      Musculoskeletal and Integument   Olecranon bursitis of right elbow    Other Visit Diagnoses    Mild persistent reactive airway disease with acute exacerbation    -  Primary   Relevant Medications   predniSONE (DELTASONE) 20 MG tablet   HYDROcodone-homatropine (HYCODAN) 5-1.5 MG/5ML syrup      Meds ordered this encounter  Medications  . predniSONE (DELTASONE) 20 MG tablet    Sig: Take 1 tablet (20 mg total) by mouth 2  (two) times daily with a meal for 7 days.    Dispense:  14 tablet    Refill:  0  . HYDROcodone-homatropine (HYCODAN) 5-1.5 MG/5ML syrup    Sig: Take 5 mLs by mouth every 8 (eight) hours as needed for cough.    Dispense:  120 mL    Refill:  0    Follow-up: Return if symptoms worsen or fail to improve.  Patient will use cough syrup mostly at night.  He will use the prednisone 20 mg twice daily in lieu of the last couple meloxicam.

## 2019-01-14 ENCOUNTER — Other Ambulatory Visit: Payer: Self-pay | Admitting: Family Medicine

## 2019-01-14 DIAGNOSIS — M7021 Olecranon bursitis, right elbow: Secondary | ICD-10-CM

## 2019-02-12 ENCOUNTER — Other Ambulatory Visit: Payer: Self-pay | Admitting: Family Medicine

## 2019-02-12 DIAGNOSIS — M7021 Olecranon bursitis, right elbow: Secondary | ICD-10-CM

## 2019-03-05 ENCOUNTER — Other Ambulatory Visit: Payer: Self-pay | Admitting: Family Medicine

## 2019-03-05 DIAGNOSIS — E559 Vitamin D deficiency, unspecified: Secondary | ICD-10-CM

## 2019-03-29 ENCOUNTER — Other Ambulatory Visit: Payer: Self-pay | Admitting: Family Medicine

## 2019-03-29 DIAGNOSIS — M7021 Olecranon bursitis, right elbow: Secondary | ICD-10-CM

## 2019-04-23 ENCOUNTER — Other Ambulatory Visit: Payer: Self-pay | Admitting: Family Medicine

## 2019-04-23 DIAGNOSIS — M7021 Olecranon bursitis, right elbow: Secondary | ICD-10-CM

## 2019-04-23 NOTE — Telephone Encounter (Signed)
Dr. Doreene Burke please advise last OV was 12/15/2018 last refill #30 no refills 03/29/2019

## 2019-05-25 ENCOUNTER — Encounter: Payer: 59 | Admitting: Family Medicine

## 2019-05-25 ENCOUNTER — Encounter: Payer: Self-pay | Admitting: Family Medicine

## 2019-05-25 ENCOUNTER — Ambulatory Visit (INDEPENDENT_AMBULATORY_CARE_PROVIDER_SITE_OTHER): Payer: 59 | Admitting: Family Medicine

## 2019-05-25 VITALS — BP 128/80 | HR 79 | Temp 98.5°F | Ht 69.0 in | Wt 213.5 lb

## 2019-05-25 DIAGNOSIS — E538 Deficiency of other specified B group vitamins: Secondary | ICD-10-CM

## 2019-05-25 DIAGNOSIS — E7800 Pure hypercholesterolemia, unspecified: Secondary | ICD-10-CM

## 2019-05-25 DIAGNOSIS — D649 Anemia, unspecified: Secondary | ICD-10-CM | POA: Diagnosis not present

## 2019-05-25 DIAGNOSIS — E669 Obesity, unspecified: Secondary | ICD-10-CM

## 2019-05-25 DIAGNOSIS — E559 Vitamin D deficiency, unspecified: Secondary | ICD-10-CM

## 2019-05-25 DIAGNOSIS — Z1159 Encounter for screening for other viral diseases: Secondary | ICD-10-CM | POA: Insufficient documentation

## 2019-05-25 DIAGNOSIS — R5383 Other fatigue: Secondary | ICD-10-CM | POA: Diagnosis not present

## 2019-05-25 DIAGNOSIS — E876 Hypokalemia: Secondary | ICD-10-CM

## 2019-05-25 DIAGNOSIS — R21 Rash and other nonspecific skin eruption: Secondary | ICD-10-CM

## 2019-05-25 DIAGNOSIS — R7309 Other abnormal glucose: Secondary | ICD-10-CM

## 2019-05-25 DIAGNOSIS — Z0000 Encounter for general adult medical examination without abnormal findings: Secondary | ICD-10-CM | POA: Diagnosis not present

## 2019-05-25 DIAGNOSIS — Z23 Encounter for immunization: Secondary | ICD-10-CM | POA: Diagnosis not present

## 2019-05-25 DIAGNOSIS — I10 Essential (primary) hypertension: Secondary | ICD-10-CM

## 2019-05-25 DIAGNOSIS — R635 Abnormal weight gain: Secondary | ICD-10-CM

## 2019-05-25 DIAGNOSIS — Z125 Encounter for screening for malignant neoplasm of prostate: Secondary | ICD-10-CM | POA: Insufficient documentation

## 2019-05-25 LAB — LIPID PANEL
Cholesterol: 167 mg/dL (ref 0–200)
HDL: 31 mg/dL — ABNORMAL LOW (ref >39.00)
NonHDL: 135.96
Total CHOL/HDL Ratio: 5
Triglycerides: 275 mg/dL — ABNORMAL HIGH (ref 0.0–149.0)
VLDL: 55 mg/dL — ABNORMAL HIGH (ref 0.0–40.0)

## 2019-05-25 LAB — URINALYSIS, ROUTINE W REFLEX MICROSCOPIC
Bilirubin Urine: NEGATIVE
Hgb urine dipstick: NEGATIVE
Ketones, ur: NEGATIVE
Leukocytes,Ua: NEGATIVE
Nitrite: NEGATIVE
Specific Gravity, Urine: 1.02 (ref 1.000–1.030)
Total Protein, Urine: NEGATIVE
Urine Glucose: NEGATIVE
Urobilinogen, UA: 0.2 (ref 0.0–1.0)
pH: 6 (ref 5.0–8.0)

## 2019-05-25 LAB — CBC
HCT: 40.3 % (ref 39.0–52.0)
Hemoglobin: 14.4 g/dL (ref 13.0–17.0)
MCHC: 35.8 g/dL (ref 30.0–36.0)
MCV: 84.6 fl (ref 78.0–100.0)
Platelets: 180 10*3/uL (ref 150.0–400.0)
RBC: 4.77 Mil/uL (ref 4.22–5.81)
RDW: 12.8 % (ref 11.5–15.5)
WBC: 4.4 10*3/uL (ref 4.0–10.5)

## 2019-05-25 LAB — COMPREHENSIVE METABOLIC PANEL
ALT: 34 U/L (ref 0–53)
AST: 18 U/L (ref 0–37)
Albumin: 4.6 g/dL (ref 3.5–5.2)
Alkaline Phosphatase: 63 U/L (ref 39–117)
BUN: 18 mg/dL (ref 6–23)
CO2: 27 mEq/L (ref 19–32)
Calcium: 9.2 mg/dL (ref 8.4–10.5)
Chloride: 101 mEq/L (ref 96–112)
Creatinine, Ser: 1.25 mg/dL (ref 0.40–1.50)
GFR: 60.43 mL/min (ref >60.00)
Glucose, Bld: 120 mg/dL — ABNORMAL HIGH (ref 70–99)
Potassium: 3.2 mEq/L — ABNORMAL LOW (ref 3.5–5.1)
Sodium: 140 mEq/L (ref 135–145)
Total Bilirubin: 0.8 mg/dL (ref 0.2–1.2)
Total Protein: 7 g/dL (ref 6.0–8.3)

## 2019-05-25 LAB — TESTOSTERONE: Testosterone: 165.5 ng/dL — ABNORMAL LOW (ref 300.00–890.00)

## 2019-05-25 LAB — VITAMIN B12: Vitamin B-12: 177 pg/mL — ABNORMAL LOW (ref 211–911)

## 2019-05-25 LAB — PSA: PSA: 1.03 ng/mL (ref 0.10–4.00)

## 2019-05-25 LAB — LDL CHOLESTEROL, DIRECT: Direct LDL: 87 mg/dL

## 2019-05-25 LAB — VITAMIN D 25 HYDROXY (VIT D DEFICIENCY, FRACTURES): VITD: 44.6 ng/mL (ref 30.00–100.00)

## 2019-05-25 MED ORDER — TRIAMCINOLONE ACETONIDE 0.5 % EX OINT: 1.0000 "application " | TOPICAL_OINTMENT | Freq: Two times a day (BID) | CUTANEOUS | 0 refills | Status: DC

## 2019-05-25 NOTE — Progress Notes (Addendum)
Established Patient Office Visit  Subjective:  Patient ID: Henry Hernandez, male    DOB: 1966-05-06  Age: 53 y.o. MRN: [id number]  CC:  Chief Complaint  Patient presents with  . Annual Exam    HPI [name] presents for routine physical and follow-up of his hypertension, elevated cholesterol and vitamin D deficiency.  Of course patient has been stressed with the pandemic.  Continues to work as a Administrator, arts.  Mostly stressed about his weight gain.  Admits to some stress eating and decreased exercise.  He feels more anxious with his weight gain and seeming lack of the ability to establish an exercise routine.  He has had a rash on his chest that he has treated with antifungals without help.  Blood pressure is been well controlled on his current regimen.  He is having no issues taking the atorvastatin.  Metoprolol controls palpitations.  He takes meloxicam on a as needed basis for occasional back pain associated with 2 prior surgeries.  Past Medical History:  Diagnosis Date  . Allergy   . Chicken pox   . Hyperlipidemia   . Hypertension     Past Surgical History:  Procedure Laterality Date  . BACK SURGERY      Family History  Problem Relation Age of Onset  . Arthritis Mother   . COPD Mother   . Diabetes Mother   . Heart attack Mother   . Heart disease Mother   . Hyperlipidemia Mother   . Hypertension Mother   . Kidney disease Mother   . Stroke Mother   . Hypertension Father   . Hypertension Sister   . Hypertension Brother   . Cancer Maternal Grandmother   . Hypertension Maternal Grandmother     Social History   Socioeconomic History  . Marital status: Married    Spouse name: Not on file  . Number of children: Not on file  . Years of education: Not on file  . Highest education level: Not on file  Occupational History  . Not on file  Social Needs  . Financial resource strain: Not on file  . Food insecurity:    Worry: Not on file    Inability: Not on file  .  Transportation needs:    Medical: Not on file    Non-medical: Not on file  Tobacco Use  . Smoking status: Never Smoker  . Smokeless tobacco: Never Used  Substance and Sexual Activity  . Alcohol use: Not on file  . Drug use: Not on file  . Sexual activity: Not on file  Lifestyle  . Physical activity:    Days per week: Not on file    Minutes per session: Not on file  . Stress: Not on file  Relationships  . Social connections:    Talks on phone: Not on file    Gets together: Not on file    Attends religious service: Not on file    Active member of club or organization: Not on file    Attends meetings of clubs or organizations: Not on file    Relationship status: Not on file  . Intimate partner violence:    Fear of current or ex partner: Not on file    Emotionally abused: Not on file    Physically abused: Not on file    Forced sexual activity: Not on file  Other Topics Concern  . Not on file  Social History Narrative  . Not on file    Outpatient Medications  Prior to Visit  Medication Sig Dispense Refill  . aspirin EC 81 MG tablet Take 1 tablet by mouth daily.    [name] atorvastatin (LIPITOR) 20 MG tablet Take 1 tablet (20 mg total) by mouth daily. 90 tablet 1  . meloxicam (MOBIC) 15 MG tablet TAKE 1 TABLET (15 MG TOTAL) BY MOUTH DAILY. FOR 10 DAYS AND THEN AS NEEDED WITH FOOD. 30 tablet 0  . metoprolol succinate (TOPROL-XL) 25 MG 24 hr tablet Take 1 tablet (25 mg total) by mouth daily. 100 tablet 1  . olmesartan-hydrochlorothiazide (BENICAR HCT) 40-25 MG tablet Take 1 tablet by mouth daily. 90 tablet 1  . Vitamin D, Ergocalciferol, (DRISDOL) 1.25 MG (50000 UT) CAPS capsule TAKE 1 CAPSULE BY MOUTH ONE TIME PER WEEK 18 capsule 0  . HYDROcodone-homatropine (HYCODAN) 5-1.5 MG/5ML syrup Take 5 mLs by mouth every 8 (eight) hours as needed for cough. 120 mL 0   No facility-administered medications prior to visit.     No Known Allergies  ROS Review of Systems  Constitutional:  Negative for diaphoresis, fatigue, fever and unexpected weight change.  HENT: Negative.   Eyes: Negative for photophobia and visual disturbance.  Respiratory: Negative.   Cardiovascular: Negative.   Gastrointestinal: Negative.   Endocrine: Negative for polyphagia and polyuria.  Genitourinary: Negative for decreased urine volume, difficulty urinating, frequency and hematuria.  Musculoskeletal: Negative for gait problem and joint swelling.  Allergic/Immunologic: Negative for immunocompromised state.  Neurological: Negative for weakness.  Hematological: Does not bruise/bleed easily.  Psychiatric/Behavioral: The patient is nervous/anxious.       Objective:    Physical Exam  Constitutional: He is oriented to person, place, and time. He appears well-developed and well-nourished. No distress.  HENT:  Head: Normocephalic and atraumatic.  Right Ear: External ear normal.  Left Ear: External ear normal.  Mouth/Throat: Oropharynx is clear and moist. No oropharyngeal exudate.  Eyes: Pupils are equal, round, and reactive to light. Conjunctivae are normal. Right eye exhibits no discharge. Left eye exhibits no discharge. No scleral icterus.  Neck: Neck supple. No JVD present. No tracheal deviation present. No thyromegaly present.  Cardiovascular: Normal rate, regular rhythm and normal heart sounds.  Pulmonary/Chest: Effort normal and breath sounds normal. No stridor.  Abdominal: Soft. Bowel sounds are normal. He exhibits no distension. There is no abdominal tenderness. There is no rebound and no guarding.  Musculoskeletal:     Lumbar back: He exhibits normal range of motion and no tenderness.       Back:  Lymphadenopathy:    He has no cervical adenopathy.  Neurological: He is alert and oriented to person, place, and time.  Skin: Skin is warm and dry. He is not diaphoretic.     Psychiatric: He has a normal mood and affect.    BP 128/80   Pulse 79   Temp 98.5 F (36.9 C) (Oral)   Ht 5'  9" (1.753 m)   Wt 213 lb 8 oz (96.8 kg)   SpO2 95%   BMI 31.53 kg/m  Wt Readings from Last 3 Encounters:  05/25/19 213 lb 8 oz (96.8 kg)  11/30/18 209 lb 4 oz (94.9 kg)  03/13/18 211 lb (95.7 kg)   BP Readings from Last 3 Encounters:  05/25/19 128/80  12/15/18 124/76  11/30/18 120/70   Guideline developer:  UpToDate (see UpToDate for funding source) Date Released: June 2014  There are no preventive care reminders to display for this patient.  There are no preventive care reminders to display for this  patient.  Lab Results  Component Value Date   TSH 3.33 03/13/2018   Lab Results  Component Value Date   WBC 4.4 05/25/2019   HGB 14.4 05/25/2019   HCT 40.3 05/25/2019   MCV 84.6 05/25/2019   PLT 180.0 05/25/2019   Lab Results  Component Value Date   NA 140 05/25/2019   K 3.3 (L) 06/01/2019   CO2 27 05/25/2019   GLUCOSE 120 (H) 05/25/2019   BUN 18 05/25/2019   CREATININE 1.25 05/25/2019   BILITOT 0.8 05/25/2019   ALKPHOS 63 05/25/2019   AST 18 05/25/2019   ALT 34 05/25/2019   PROT 7.0 05/25/2019   ALBUMIN 4.6 05/25/2019   CALCIUM 9.2 05/25/2019   GFR 60.43 05/25/2019   Lab Results  Component Value Date   CHOL 167 05/25/2019   Lab Results  Component Value Date   HDL 31.00 (L) 05/25/2019   Lab Results  Component Value Date   LDLCALC 85 11/30/2018   Lab Results  Component Value Date   TRIG 275.0 (H) 05/25/2019   Lab Results  Component Value Date   CHOLHDL 5 05/25/2019   No results found for: HGBA1C    Assessment & Plan:   Problem List Items Addressed This Visit      Cardiovascular and Mediastinum   Essential hypertension   Relevant Orders   Comprehensive metabolic panel (Completed)   Urinalysis, Routine w reflex microscopic (Completed)     Musculoskeletal and Integument   Rash   Relevant Medications   triamcinolone ointment (KENALOG) 0.5 %     Other   Elevated cholesterol   Relevant Orders   Comprehensive metabolic panel (Completed)    Lipid panel (Completed)   LDL cholesterol, direct (Completed)   Weight gain   Relevant Medications   Liraglutide -Weight Management (SAXENDA) 18 MG/3ML SOPN   Fatigue   Relevant Medications   b complex vitamins capsule   Other Relevant Orders   CBC (Completed)   Comprehensive metabolic panel (Completed)   PSA (Completed)   Testosterone (Completed)   Vitamin B12 (Completed)   Iron, TIBC and Ferritin Panel (Completed)   HIV Antibody (routine testing w rflx) (Completed)   Vitamin D deficiency   Relevant Orders   VITAMIN D 25 Hydroxy (Vit-D Deficiency, Fractures) (Completed)   Healthcare maintenance - Primary   Relevant Orders   CBC (Completed)   Comprehensive metabolic panel (Completed)   Anemia   Relevant Orders   Vitamin B12 (Completed)   Iron, TIBC and Ferritin Panel (Completed)   Hypokalemia   Relevant Medications   potassium chloride SA (K-DUR) 20 MEQ tablet   Other Relevant Orders   Potassium (Completed)   Obesity (BMI 30-39.9)   Relevant Medications   Liraglutide -Weight Management (SAXENDA) 18 MG/3ML SOPN   Elevated glucose   Relevant Medications   Liraglutide -Weight Management (SAXENDA) 18 MG/3ML SOPN   B12 deficiency   Relevant Medications   b complex vitamins capsule    Other Visit Diagnoses    Need for Tdap vaccination       Relevant Orders   Tdap vaccine greater than or equal to 7yo IM (Completed)      Meds ordered this encounter  Medications  . triamcinolone ointment (KENALOG) 0.5 %    Sig: Apply 1 application topically 2 (two) times daily. To rash on chest only.    Dispense:  30 g    Refill:  0  . b complex vitamins capsule    Sig: Take 1 capsule by mouth  daily.    Dispense:  90 capsule    Refill:  1  . Liraglutide -Weight Management (SAXENDA) 18 MG/3ML SOPN    Sig: Inject 3 mg into the skin daily for 30 days.    Dispense:  5 pen    Refill:  2  . potassium chloride SA (K-DUR) 20 MEQ tablet    Sig: Take 1 tablet (20 mEq total) by mouth  daily.    Dispense:  30 tablet    Refill:  3    Follow-up: Return in about 3 months (around 08/25/2019), or if symptoms worsen or fail to improve.   Patient was given mindfulness information to help with anxiety.  Invited him to come back if this does not help.  He will use triamcinolone on his rash and let me know if it does not resolve.  Encouraged him to work on exercise regimen into his routine and believe that this would help his sense of wellbeing and anxiety as well.  5/29 addendum: Discussed patient's blood work with him today.  He will try B complex to treat his low B12 levels.  Suggested this may be a cause of his fatigue.  Testosterone was low.  Patient with like to try to lose some weight and have it rechecked.  Agreed and supported the decision.  Believes that recent gain in weight has a lot to do with his current sense of wellbeing.  Glucose was elevated as were triglycerides.  Also believe that this these could be explained by recent weight gain.  Follow-up Saxenda.  Patient will follow-up for repeat potassium at his convenience in the next week or so.  Follow-up will be in 3 months.

## 2019-05-25 NOTE — Patient Instructions (Signed)

## 2019-05-26 LAB — IRON,TIBC AND FERRITIN PANEL
%SAT: 32 % (calc) (ref 20–48)
Ferritin: 219 ng/mL (ref 38–380)
Iron: 101 ug/dL (ref 50–180)
TIBC: 318 mcg/dL (calc) (ref 250–425)

## 2019-05-26 LAB — HIV ANTIBODY (ROUTINE TESTING W REFLEX): HIV 1&2 Ab, 4th Generation: NONREACTIVE

## 2019-05-28 ENCOUNTER — Other Ambulatory Visit: Payer: Self-pay | Admitting: Family Medicine

## 2019-05-28 ENCOUNTER — Encounter: Payer: Self-pay | Admitting: Family Medicine

## 2019-05-28 DIAGNOSIS — I10 Essential (primary) hypertension: Secondary | ICD-10-CM

## 2019-05-28 DIAGNOSIS — E669 Obesity, unspecified: Secondary | ICD-10-CM | POA: Insufficient documentation

## 2019-05-28 DIAGNOSIS — E876 Hypokalemia: Secondary | ICD-10-CM | POA: Insufficient documentation

## 2019-05-28 DIAGNOSIS — E538 Deficiency of other specified B group vitamins: Secondary | ICD-10-CM | POA: Insufficient documentation

## 2019-05-28 DIAGNOSIS — E559 Vitamin D deficiency, unspecified: Secondary | ICD-10-CM

## 2019-05-28 DIAGNOSIS — R7309 Other abnormal glucose: Secondary | ICD-10-CM | POA: Insufficient documentation

## 2019-05-28 MED ORDER — LIRAGLUTIDE -WEIGHT MANAGEMENT 18 MG/3ML ~~LOC~~ SOPN: 3.0000 mg | PEN_INJECTOR | Freq: Every day | SUBCUTANEOUS | 2 refills | Status: AC

## 2019-05-28 MED ORDER — B COMPLEX VITAMINS PO CAPS: 1.0000 | ORAL_CAPSULE | Freq: Every day | ORAL | 1 refills | Status: DC

## 2019-05-28 NOTE — Addendum Note (Signed)
Addended by: Andrez Grime on: 05/28/2019 10:33 AM   Modules accepted: Orders, Level of Service

## 2019-05-31 ENCOUNTER — Other Ambulatory Visit: Payer: Self-pay

## 2019-05-31 MED ORDER — INSULIN PEN NEEDLE 32G X 4 MM MISC: 3 refills | Status: DC

## 2019-06-01 ENCOUNTER — Other Ambulatory Visit (INDEPENDENT_AMBULATORY_CARE_PROVIDER_SITE_OTHER): Payer: Self-pay

## 2019-06-01 ENCOUNTER — Encounter: Payer: 59 | Admitting: Family Medicine

## 2019-06-01 ENCOUNTER — Telehealth: Payer: Self-pay

## 2019-06-01 DIAGNOSIS — E876 Hypokalemia: Secondary | ICD-10-CM

## 2019-06-01 NOTE — Telephone Encounter (Signed)
Needs potassium rechecked.

## 2019-06-01 NOTE — Telephone Encounter (Signed)
Questions for Screening COVID-19  Symptom onset: N/A  Travel or Contacts: N/A  During this illness, did/does the patient experience any of the following symptoms? Fever >100.74F []   Yes [x]   No []   Unknown Subjective fever (felt feverish) []   Yes [x]   No []   Unknown Chills []   Yes [x]   No []   Unknown Muscle aches (myalgia) []   Yes [x]   No []   Unknown Runny nose (rhinorrhea) []   Yes [x]   No []   Unknown Sore throat []   Yes [x]   No []   Unknown Cough (new onset or worsening of chronic cough) []   Yes [x]   No []   Unknown Shortness of breath (dyspnea) []   Yes [x]   No []   Unknown Nausea or vomiting []   Yes [x]   No []   Unknown Headache []   Yes [x]   No []   Unknown Abdominal pain  []   Yes [x]   No []   Unknown Diarrhea (?3 loose/looser than normal stools/24hr period) []   Yes [x]   No []   Unknown Other, specify:  Patient risk factors: Smoker? []   Current []   Former []   Never If male, currently pregnant? []   Yes []   No  Patient Active Problem List   Diagnosis Date Noted  . Hypokalemia 05/28/2019  . Obesity (BMI 30-39.9) 05/28/2019  . Elevated glucose 05/28/2019  . B12 deficiency 05/28/2019  . Healthcare maintenance 05/25/2019  . Anemia 05/25/2019  . Rash 05/25/2019  . Olecranon bursitis of right elbow 11/30/2018  . Essential hypertension 03/13/2018  . Elevated cholesterol 03/13/2018  . Weight gain 03/13/2018  . Fatigue 03/13/2018  . Vitamin D deficiency 03/13/2018    Plan:  []   High risk for COVID-19 with red flags go to ED (with CP, SOB, weak/lightheaded, or fever > 101.5). Call ahead.  []   High risk for COVID-19 but stable. Inform provider and coordinate time for [facility] visit.   []   No red flags but URI signs or symptoms okay for [facility] visit.

## 2019-06-01 NOTE — Telephone Encounter (Signed)
Spoke with pt, made appointment for today at 3pm for potassium-recheck.

## 2019-06-01 NOTE — Telephone Encounter (Signed)
Copied from CRM [id number]. Topic: Quick Communication - See Telephone Encounter >> May 31, 2019  4:27 PM Jens Som A wrote: CRM for notification. See Telephone encounter for: 05/31/19.  Patient is returning Dr. Evangeline Gula call to return for an OV. Patient is unsure why he needs to return. Please advise CB- [phone]

## 2019-06-02 LAB — POTASSIUM: Potassium: 3.3 mmol/L — ABNORMAL LOW (ref 3.5–5.3)

## 2019-06-02 MED ORDER — POTASSIUM CHLORIDE CRYS ER 20 MEQ PO TBCR: 20.0000 meq | EXTENDED_RELEASE_TABLET | Freq: Every day | ORAL | 3 refills | Status: DC

## 2019-06-02 NOTE — Addendum Note (Signed)
Addended by: Nadene Rubins A on: 06/02/2019 10:50 AM   Modules accepted: Orders

## 2019-06-23 ENCOUNTER — Other Ambulatory Visit: Payer: Self-pay | Admitting: Family Medicine

## 2019-06-23 DIAGNOSIS — M7021 Olecranon bursitis, right elbow: Secondary | ICD-10-CM

## 2019-07-28 ENCOUNTER — Other Ambulatory Visit: Payer: Self-pay | Admitting: Family Medicine

## 2019-07-28 DIAGNOSIS — M7021 Olecranon bursitis, right elbow: Secondary | ICD-10-CM

## 2019-08-19 ENCOUNTER — Other Ambulatory Visit: Payer: Self-pay | Admitting: Family Medicine

## 2019-08-19 DIAGNOSIS — E7800 Pure hypercholesterolemia, unspecified: Secondary | ICD-10-CM

## 2019-08-22 ENCOUNTER — Other Ambulatory Visit: Payer: Self-pay | Admitting: Family Medicine

## 2019-08-22 DIAGNOSIS — I10 Essential (primary) hypertension: Secondary | ICD-10-CM

## 2019-08-23 ENCOUNTER — Other Ambulatory Visit: Payer: Self-pay | Admitting: Family Medicine

## 2019-08-23 DIAGNOSIS — M7021 Olecranon bursitis, right elbow: Secondary | ICD-10-CM

## 2019-08-25 ENCOUNTER — Other Ambulatory Visit: Payer: Self-pay | Admitting: Family Medicine

## 2019-08-25 DIAGNOSIS — E876 Hypokalemia: Secondary | ICD-10-CM

## 2019-10-01 ENCOUNTER — Other Ambulatory Visit: Payer: Self-pay | Admitting: Family Medicine

## 2019-10-01 DIAGNOSIS — M7021 Olecranon bursitis, right elbow: Secondary | ICD-10-CM

## 2019-12-15 ENCOUNTER — Other Ambulatory Visit: Payer: Self-pay

## 2019-12-15 ENCOUNTER — Emergency Department (INDEPENDENT_AMBULATORY_CARE_PROVIDER_SITE_OTHER)
Admission: EM | Admit: 2019-12-15 | Discharge: 2019-12-15 | Disposition: A | Discharge status: Home / Self Care | Payer: Managed Care, Other (non HMO) | Source: Home / Self Care | Attending: Emergency Medicine | Admitting: Emergency Medicine

## 2019-12-15 ENCOUNTER — Encounter: Payer: Self-pay | Admitting: Emergency Medicine

## 2019-12-15 DIAGNOSIS — Z20822 Contact with and (suspected) exposure to covid-19: Secondary | ICD-10-CM

## 2019-12-15 DIAGNOSIS — R5383 Other fatigue: Secondary | ICD-10-CM

## 2019-12-15 LAB — POC SARS CORONAVIRUS 2 AG -  ED: SARS Coronavirus 2 Ag: NEGATIVE

## 2019-12-15 NOTE — ED Triage Notes (Signed)
Pt was exposed to coworker with covid. He is having symptoms today of fatigue and body aches.

## 2019-12-15 NOTE — ED Provider Notes (Signed)
[facility]    CSN: [id number] Arrival date & time: 12/15/19  0901      History   Chief Complaint Chief Complaint  Patient presents with  . covid exposure    HPI Henry Hernandez is a 53 y.o. male.  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  He subsequently tested positive on Monday.  Which is 2 days ago.  Patient last night developed myalgias, fatigue, and a bloated sensation.  He has not had fever he has not had shortness of breath or chest discomfort.  He has a mild nasal congestion and has had loss of taste and smell. HPI  Past Medical History:  Diagnosis Date  . Allergy   . Chicken pox   . Hyperlipidemia   . Hypertension     Patient Active Problem List   Diagnosis Date Noted  . Hypokalemia 05/28/2019  . Obesity (BMI 30-39.9) 05/28/2019  . Elevated glucose 05/28/2019  . B12 deficiency 05/28/2019  . Healthcare maintenance 05/25/2019  . Anemia 05/25/2019  . Rash 05/25/2019  . Olecranon bursitis of right elbow 11/30/2018  . Essential hypertension 03/13/2018  . Elevated cholesterol 03/13/2018  . Weight gain 03/13/2018  . Fatigue 03/13/2018  . Vitamin D deficiency 03/13/2018    Past Surgical History:  Procedure Laterality Date  . BACK SURGERY         Home Medications    Prior to Admission medications   Medication Sig Start Date End Date Taking? Authorizing Provider  aspirin EC 81 MG tablet Take 1 tablet by mouth daily.    [provider]  atorvastatin (LIPITOR) 20 MG tablet TAKE 1 TABLET BY MOUTH EVERY DAY 08/19/19   Libby Maw, MD  b complex vitamins capsule Take 1 capsule by mouth daily. 05/28/19   Libby Maw, MD  Insulin Pen Needle (BD PEN NEEDLE NANO U/F) 32G X 4 MM MISC To use with saxenda 05/31/19   Libby Maw, MD  KLOR-CON M20 20 MEQ tablet TAKE 1 TABLET BY MOUTH EVERY DAY 08/25/19   Libby Maw, MD  meloxicam (MOBIC) 15 MG tablet TAKE 1 TABLET (15 MG TOTAL) BY MOUTH DAILY.  FOR 10 DAYS AND THEN AS NEEDED WITH FOOD. 10/01/19   Libby Maw, MD  metoprolol succinate (TOPROL-XL) 25 MG 24 hr tablet TAKE 1 TABLET BY MOUTH EVERY DAY 08/23/19   Libby Maw, MD  olmesartan-hydrochlorothiazide [facility] HCT) 40-25 MG tablet TAKE 1 TABLET BY MOUTH EVERY DAY 05/28/19   Libby Maw, MD  triamcinolone ointment (KENALOG) 0.5 % Apply 1 application topically 2 (two) times daily. To rash on chest only. 05/25/19   Libby Maw, MD  Vitamin D, Ergocalciferol, (DRISDOL) 1.25 MG (50000 UT) CAPS capsule TAKE ONE CAPSULE BY MOUTH ONE TIME PER WEEK 05/28/19   Libby Maw, MD    Family History Family History  Problem Relation Age of Onset  . Arthritis Mother   . COPD Mother   . Diabetes Mother   . Heart attack Mother   . Heart disease Mother   . Hyperlipidemia Mother   . Hypertension Mother   . Kidney disease Mother   . Stroke Mother   . Hypertension Father   . Hypertension Sister   . Hypertension Brother   . Cancer Maternal Grandmother   . Hypertension Maternal Grandmother     Social History Social History   Tobacco Use  . Smoking status: Never Smoker  . Smokeless tobacco: Never Used  Substance Use Topics  . Alcohol use: Not on file  . Drug use: Not on file     Allergies   Patient has no known allergies.   Review of Systems Review of Systems  Constitutional: Positive for fatigue. Negative for chills and fever.  HENT: Positive for congestion. Negative for sore throat.        He has noted loss of taste and smell  Eyes: Negative.   Respiratory: Negative for cough, shortness of breath and wheezing.   Cardiovascular: Negative.   Gastrointestinal:       He feels unsettled in his abdomen but no nausea or vomiting.     Physical Exam Triage Vital Signs ED Triage Vitals  Enc Vitals Group     BP 12/15/19 0918 (!) 146/89     Pulse Rate 12/15/19 0918 60     Resp --      Temp 12/15/19 0918 97.9 F (36.6 C)      Temp Source 12/15/19 0918 Oral     SpO2 12/15/19 0918 99 %     Weight --      Height --      Head Circumference --      Peak Flow --      Pain Score 12/15/19 0916 0     Pain Loc --      Pain Edu? --      Excl. in GC? --    No data found.  Updated Vital Signs BP (!) 146/89 (BP Location: Right Arm)   Pulse 60   Temp 97.9 F (36.6 C) (Oral)   SpO2 99%   Visual Acuity Right Eye Distance:   Left Eye Distance:   Bilateral Distance:    Right Eye Near:   Left Eye Near:    Bilateral Near:     Physical Exam Constitutional:      Appearance: Normal appearance.  HENT:     Head: Normocephalic.     Right Ear: Tympanic membrane normal.     Left Ear: Tympanic membrane normal.     Nose: Nose normal.     Mouth/Throat:     Mouth: Mucous membranes are moist.     Pharynx: Oropharynx is clear.  Cardiovascular:     Rate and Rhythm: Normal rate and regular rhythm.  Pulmonary:     Effort: Pulmonary effort is normal.     Breath sounds: Normal breath sounds.  Abdominal:     Palpations: Abdomen is soft.  Skin:    General: Skin is warm and dry.  Neurological:     General: No focal deficit present.     Mental Status: He is alert.      UC Treatments / Results  Labs (all labs ordered are listed, but only abnormal results are displayed) Labs Reviewed  POC SARS CORONAVIRUS 2 AG -  ED  POC SARS CORONAVIRUS 2 AG -  ED    EKG   Radiology No results found.  Procedures Procedures (including critical care time)  Medications Ordered in UC Medications - No data to display  Initial Impression / Assessment and Plan / UC Course  I have reviewed the triage vital signs and the nursing notes. Patient had significant Covid exposure.  We will proceed with rapid testing and if negative proceed with PCR testing. Rapid testing negative PCR will be sent.  He certainly is highly suspicious that he could have Covid.  He will be under quarantine for now.  Rapid testing today was negative  Pertinent labs &  imaging results that were available during my care of the patient were reviewed by me and considered in my medical decision making (see chart for details).      Final Clinical Impressions(s) / UC Diagnoses   Final diagnoses:  Fatigue, unspecified type  Close exposure to COVID-19 virus     Discharge Instructions     You are currently under quarantine. You can take your vitamin D as previous. Consider zinc 50 mg a day. Consider vitamin C 500 mg twice a day. Please check my chart to get results of your Covid testing. Contacts at home should be tested if PCR is positive.    ED Prescriptions    None     PDMP not reviewed this encounter.   Collene Gobbleaub, Arie Powell A, MD 12/15/19 [phone]

## 2019-12-15 NOTE — Discharge Instructions (Addendum)
You are currently under quarantine. Please obtain a pulse oximeter and monitor your pulse ox.  If oxygen level drops below 95 or you have respiratory difficulty you need to present to the emergency room. You can take your vitamin D as previous. Consider zinc 50 mg a day. Consider vitamin C 500 mg twice a day. Please check my chart to get results of your Covid testing. Contacts at home should be tested if PCR is positive.

## 2019-12-17 LAB — NOVEL CORONAVIRUS, NAA: SARS-CoV-2, NAA: NOT DETECTED

## 2019-12-20 ENCOUNTER — Other Ambulatory Visit: Payer: Self-pay | Admitting: Family Medicine

## 2019-12-20 DIAGNOSIS — M7021 Olecranon bursitis, right elbow: Secondary | ICD-10-CM

## 2020-01-27 ENCOUNTER — Other Ambulatory Visit: Payer: Self-pay | Admitting: Family Medicine

## 2020-01-27 DIAGNOSIS — M7021 Olecranon bursitis, right elbow: Secondary | ICD-10-CM

## 2020-02-12 ENCOUNTER — Emergency Department (INDEPENDENT_AMBULATORY_CARE_PROVIDER_SITE_OTHER)
Admission: EM | Admit: 2020-02-12 | Discharge: 2020-02-12 | Disposition: A | Discharge status: Home / Self Care | Payer: 59 | Source: Home / Self Care

## 2020-02-12 ENCOUNTER — Other Ambulatory Visit: Payer: Self-pay

## 2020-02-12 ENCOUNTER — Encounter: Payer: Self-pay | Admitting: Family Medicine

## 2020-02-12 DIAGNOSIS — Z20822 Contact with and (suspected) exposure to covid-19: Secondary | ICD-10-CM

## 2020-02-12 DIAGNOSIS — R112 Nausea with vomiting, unspecified: Secondary | ICD-10-CM | POA: Diagnosis not present

## 2020-02-12 MED ORDER — ONDANSETRON 8 MG PO TBDP: 8.0000 mg | ORAL_TABLET | Freq: Three times a day (TID) | ORAL | 0 refills | Status: DC | PRN

## 2020-02-12 NOTE — ED Provider Notes (Addendum)
[facility]    CSN: [id number] Arrival date & time: 02/12/20  1243      History   Chief Complaint Chief Complaint  Patient presents with  . Fatigue  . Emesis  . Diarrhea  . covid exposure    HPI Henry Hernandez is a 54 y.o. male.   [facility]ablished [facility] patient here for nausea, vomiting and diarrhea beginning this morning.  Patient has had some congestion for 2 or 3 days.  He had a little discomfort in his ears last night and he took Zyrtec and Sudafed.  He also is very fatigued, which is unusual for him.    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  He is concerned that he may have contracted Covid-19.  A coworker just came back to work after being quarantined for 10 days with a positive Covid test.  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  When he woke up this morning to go out to breakfast, he felt a wave of nausea.  He then went ahead and ate but had 1 episode of vomiting subsequently.  He has had no abdominal pain, shortness of breath, or significant cough.  He's had a couple loose stools this morning  Patient did have a fever of a little over 100 last night.    PMHx significant for hypertension, borderline diabetes, obesity, hyperlipidemia, Vitamin D deficiency and B12 deficiency     Past Medical History:  Diagnosis Date  . Allergy   . Chicken pox   . Hyperlipidemia   . Hypertension     Patient Active Problem List   Diagnosis Date Noted  . Hypokalemia 05/28/2019  . Obesity (BMI 30-39.9) 05/28/2019  . Elevated glucose 05/28/2019  . B12 deficiency 05/28/2019  . Healthcare maintenance 05/25/2019  . Anemia 05/25/2019  . Rash 05/25/2019  . Olecranon bursitis of right elbow 11/30/2018  . Essential hypertension 03/13/2018  . Elevated cholesterol 03/13/2018  . Weight gain 03/13/2018  . Fatigue 03/13/2018  . Vitamin D deficiency 03/13/2018    Past Surgical History:  Procedure  Laterality Date  . BACK SURGERY         Home Medications    Prior to Admission medications   Medication Sig Start Date End Date Taking? Authorizing Provider  aspirin EC 81 MG tablet Take 1 tablet by mouth daily.    [provider]  atorvastatin (LIPITOR) 20 MG tablet TAKE 1 TABLET BY MOUTH EVERY DAY 08/19/19   Mliss Sax, MD  b complex vitamins capsule Take 1 capsule by mouth daily. 05/28/19   Mliss Sax, MD  KLOR-CON M20 20 MEQ tablet TAKE 1 TABLET BY MOUTH EVERY DAY 08/25/19   Mliss Sax, MD  meloxicam (MOBIC) 15 MG tablet TAKE 1 TABLET (15 MG TOTAL) BY MOUTH DAILY. FOR 10 DAYS AND THEN AS NEEDED WITH FOOD. 01/27/20   Mliss Sax, MD  metoprolol succinate (TOPROL-XL) 25 MG 24 hr tablet TAKE 1 TABLET BY MOUTH EVERY DAY 08/23/19   Mliss Sax, MD  olmesartan-hydrochlorothiazide [facility] HCT) 40-25 MG tablet TAKE 1 TABLET BY MOUTH EVERY DAY 05/28/19   Mliss Sax, MD  ondansetron (ZOFRAN-ODT) 8 MG disintegrating tablet Take 1 tablet (8 mg total) by mouth every 8 (eight) hours as needed for nausea. 02/12/20   Elvina Sidle, MD  triamcinolone ointment (KENALOG) 0.5 % Apply 1 application topically 2 (two) times daily. To rash on  chest only. 05/25/19   Mliss Sax, MD  Vitamin D, Ergocalciferol, (DRISDOL) 1.25 MG (50000 UT) CAPS capsule TAKE ONE CAPSULE BY MOUTH ONE TIME PER WEEK 05/28/19   Mliss Sax, MD    Family History Family History  Problem Relation Age of Onset  . Arthritis Mother   . COPD Mother   . Diabetes Mother   . Heart attack Mother   . Heart disease Mother   . Hyperlipidemia Mother   . Hypertension Mother   . Kidney disease Mother   . Stroke Mother   . Hypertension Father   . Hypertension Sister   . Hypertension Brother   . Cancer Maternal Grandmother   . Hypertension Maternal Grandmother     Social History Social History   Tobacco Use  . Smoking status: Never Smoker    . Smokeless tobacco: Never Used  Substance Use Topics  . Alcohol use: Not on file  . Drug use: Not on file     Allergies   Patient has no known allergies.   Review of Systems Review of Systems  Constitutional: Positive for fatigue and fever.  HENT: Positive for congestion and ear pain.   Respiratory: Negative.   Cardiovascular: Negative.   Gastrointestinal: Positive for nausea and vomiting. Negative for abdominal pain.  Genitourinary: Negative.   All other systems reviewed and are negative.    Physical Exam Triage Vital Signs ED Triage Vitals  Enc Vitals Group     BP      Pulse      Resp      Temp      Temp src      SpO2      Weight      Height      Head Circumference      Peak Flow      Pain Score      Pain Loc      Pain Edu?      Excl. in GC?    No data found.  Updated Vital Signs BP (!) 151/80 (BP Location: Right Arm)   Pulse 80   Temp 99.4 F (37.4 C) (Oral)   Resp 16   Ht 5\' 9"  (1.753 m)   Wt 83 kg   SpO2 98%   BMI 27.02 kg/m    Physical Exam Vitals and nursing note reviewed.  Constitutional:      General: He is not in acute distress.    Appearance: Normal appearance. He is normal weight. He is not ill-appearing or toxic-appearing.  HENT:     Head: Normocephalic.     Right Ear: Tympanic membrane and external ear normal.     Left Ear: Tympanic membrane and external ear normal.     Mouth/Throat:     Mouth: Mucous membranes are moist.     Pharynx: Oropharynx is clear.  Eyes:     Conjunctiva/sclera: Conjunctivae normal.  Cardiovascular:     Rate and Rhythm: Normal rate and regular rhythm.     Heart sounds: Normal heart sounds.  Pulmonary:     Effort: Pulmonary effort is normal.     Breath sounds: Normal breath sounds.  Abdominal:     General: Bowel sounds are normal.     Tenderness: There is no abdominal tenderness.  Musculoskeletal:        General: Normal range of motion.     Cervical back: Normal range of motion and neck supple.   Skin:    General: Skin is warm and dry.  Neurological:     General: No focal deficit present.     Mental Status: He is alert and oriented to person, place, and time.  Psychiatric:        Mood and Affect: Mood normal.        Behavior: Behavior normal.        Thought Content: Thought content normal.      UC Treatments / Results  Labs (all labs ordered are listed, but only abnormal results are displayed) Labs Reviewed  NOVEL CORONAVIRUS, NAA    EKG   Radiology No results found.  Procedures Procedures (including critical care time)  Medications Ordered in UC Medications - No data to display  Initial Impression / Assessment and Plan / UC Course  I have reviewed the triage vital signs and the nursing notes.  Pertinent labs & imaging results that were available during my care of the patient were reviewed by me and considered in my medical decision making (see chart for details).     Final Clinical Impressions(s) / UC Diagnoses   Final diagnoses:  Exposure to COVID-19 virus  Nausea and vomiting, intractability of vomiting not specified, unspecified vomiting type  Close exposure to COVID-19 virus     Discharge Instructions     Call if you need medicine for nausea.  Continue vitamin D  We will let you know in the next 48 hours if your test is positive.  You can monitor my chart for results.    ED Prescriptions    Medication Sig Dispense Auth. Provider   ondansetron (ZOFRAN-ODT) 8 MG disintegrating tablet Take 1 tablet (8 mg total) by mouth every 8 (eight) hours as needed for nausea. 12 tablet Robyn Haber, MD     PDMP not reviewed this encounter.   Robyn Haber, MD 02/12/20 1325    Robyn Haber, MD 02/12/20 1334

## 2020-02-12 NOTE — Discharge Instructions (Signed)
Call if you need medicine for nausea.  Continue vitamin D  We will let you know in the next 48 hours if your test is positive.  You can monitor my chart for results.

## 2020-02-12 NOTE — ED Triage Notes (Signed)
Patient has close contacts with covid positive people. Last evening he had nausea with vomiting x 1; this morning extreme fatigue, low grade fever, nausea and diarrhea x 1. He has had influenza vacc this season.

## 2020-02-14 LAB — NOVEL CORONAVIRUS, NAA: SARS-CoV-2, NAA: NOT DETECTED

## 2020-02-17 ENCOUNTER — Other Ambulatory Visit: Payer: Self-pay

## 2020-02-17 ENCOUNTER — Emergency Department (INDEPENDENT_AMBULATORY_CARE_PROVIDER_SITE_OTHER)
Admission: EM | Admit: 2020-02-17 | Discharge: 2020-02-17 | Disposition: A | Discharge status: Home / Self Care | Payer: 59 | Source: Home / Self Care

## 2020-02-17 ENCOUNTER — Encounter: Payer: Self-pay | Admitting: Family Medicine

## 2020-02-17 ENCOUNTER — Telehealth: Payer: Self-pay

## 2020-02-17 DIAGNOSIS — A09 Infectious gastroenteritis and colitis, unspecified: Secondary | ICD-10-CM

## 2020-02-17 LAB — TIQ-NTM

## 2020-02-17 MED ORDER — METRONIDAZOLE 500 MG PO TABS: 500.0000 mg | ORAL_TABLET | Freq: Two times a day (BID) | ORAL | 0 refills | Status: DC

## 2020-02-17 NOTE — ED Provider Notes (Signed)
[facility]    CSN: [id number] Arrival date & time: 02/17/20  1206      History   Chief Complaint Chief Complaint  Patient presents with  . Diarrhea  . Abdominal Pain    HPI Henry Hernandez is a 54 y.o. male.   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  He was Covid tested, especially since a co-worker tested positive.  His test, however, was negative.  He has continued to have watery diarrhea, with one night of fever to 102.  He has lost over 5 # and his appetite is non-existent.  He has no abdominal pain or blood in stool.        Past Medical History:  Diagnosis Date  . Allergy   . Chicken pox   . Hyperlipidemia   . Hypertension     Patient Active Problem List   Diagnosis Date Noted  . Hypokalemia 05/28/2019  . Obesity (BMI 30-39.9) 05/28/2019  . Elevated glucose 05/28/2019  . B12 deficiency 05/28/2019  . Healthcare maintenance 05/25/2019  . Anemia 05/25/2019  . Rash 05/25/2019  . Olecranon bursitis of right elbow 11/30/2018  . Essential hypertension 03/13/2018  . Elevated cholesterol 03/13/2018  . Weight gain 03/13/2018  . Fatigue 03/13/2018  . Vitamin D deficiency 03/13/2018    Past Surgical History:  Procedure Laterality Date  . BACK SURGERY         Home Medications    Prior to Admission medications   Medication Sig Start Date End Date Taking? Authorizing Provider  aspirin EC 81 MG tablet Take 1 tablet by mouth daily.    [provider]  atorvastatin (LIPITOR) 20 MG tablet TAKE 1 TABLET BY MOUTH EVERY DAY 08/19/19   Mliss Sax, MD  b complex vitamins capsule Take 1 capsule by mouth daily. 05/28/19   Mliss Sax, MD  KLOR-CON M20 20 MEQ tablet TAKE 1 TABLET BY MOUTH EVERY DAY 08/25/19   Mliss Sax, MD  meloxicam (MOBIC) 15 MG tablet TAKE 1 TABLET (15 MG TOTAL) BY MOUTH DAILY. FOR 10 DAYS AND THEN AS NEEDED WITH FOOD. 01/27/20   Mliss Sax, MD    metoprolol succinate (TOPROL-XL) 25 MG 24 hr tablet TAKE 1 TABLET BY MOUTH EVERY DAY 08/23/19   Mliss Sax, MD  metroNIDAZOLE (FLAGYL) 500 MG tablet Take 1 tablet (500 mg total) by mouth 2 (two) times daily. 02/17/20   Elvina Sidle, MD  olmesartan-hydrochlorothiazide (BENICAR HCT) 40-25 MG tablet TAKE 1 TABLET BY MOUTH EVERY DAY 05/28/19   Mliss Sax, MD  ondansetron (ZOFRAN-ODT) 8 MG disintegrating tablet Take 1 tablet (8 mg total) by mouth every 8 (eight) hours as needed for nausea. 02/12/20   Elvina Sidle, MD  triamcinolone ointment (KENALOG) 0.5 % Apply 1 application topically 2 (two) times daily. To rash on chest only. 05/25/19   Mliss Sax, MD  Vitamin D, Ergocalciferol, (DRISDOL) 1.25 MG (50000 UT) CAPS capsule TAKE ONE CAPSULE BY MOUTH ONE TIME PER WEEK 05/28/19   Mliss Sax, MD    Family History Family History  Problem Relation Age of Onset  . Arthritis Mother   . COPD Mother   . Diabetes Mother   . Heart attack Mother   . Heart disease Mother   . Hyperlipidemia Mother   . Hypertension Mother   . Kidney disease Mother   . Stroke Mother   . Hypertension Father   . Hypertension Sister   .  Hypertension Brother   . Cancer Maternal Grandmother   . Hypertension Maternal Grandmother     Social History Social History   Tobacco Use  . Smoking status: Never Smoker  . Smokeless tobacco: Never Used  Substance Use Topics  . Alcohol use: Not Currently  . Drug use: Not Currently     Allergies   Patient has no known allergies.   Review of Systems Review of Systems  Constitutional: Positive for appetite change, fatigue and fever.  HENT: Negative.   Respiratory: Negative.   Cardiovascular: Negative.   Gastrointestinal: Positive for diarrhea. Negative for abdominal pain, nausea and vomiting.  Genitourinary: Negative.   Musculoskeletal: Negative.   Neurological: Positive for light-headedness.     Physical Exam Triage  Vital Signs ED Triage Vitals  Enc Vitals Group     BP      Pulse      Resp      Temp      Temp src      SpO2      Weight      Height      Head Circumference      Peak Flow      Pain Score      Pain Loc      Pain Edu?      Excl. in GC?    No data found.  Updated Vital Signs BP 117/78 (BP Location: Right Arm)   Pulse 90   Temp 99.8 F (37.7 C) (Oral)   Resp 15   Ht 5\' 10"  (1.778 m)   Wt 82.6 kg   SpO2 98%   BMI 26.11 kg/m    Physical Exam Vitals and nursing note reviewed.  Constitutional:      General: He is not in acute distress.    Appearance: He is well-developed and normal weight. He is not ill-appearing, toxic-appearing or diaphoretic.  HENT:     Head: Normocephalic.  Cardiovascular:     Rate and Rhythm: Normal rate.  Pulmonary:     Effort: Pulmonary effort is normal.  Abdominal:     General: Abdomen is flat. Bowel sounds are increased.     Palpations: Abdomen is soft.     Tenderness: There is no abdominal tenderness.  Skin:    General: Skin is warm and dry.  Neurological:     General: No focal deficit present.     Mental Status: He is alert and oriented to person, place, and time.  Psychiatric:        Mood and Affect: Mood normal.        Behavior: Behavior normal.      UC Treatments / Results  Labs (all labs ordered are listed, but only abnormal results are displayed) Labs Reviewed  GASTROINTESTINAL PATHOGEN PANEL PCR    EKG   Radiology No results found.  Procedures Procedures (including critical care time)  Medications Ordered in UC Medications - No data to display  Initial Impression / Assessment and Plan / UC Course  I have reviewed the triage vital signs and the nursing notes.  Pertinent labs & imaging results that were available during my care of the patient were reviewed by me and considered in my medical decision making (see chart for details).    Final Clinical Impressions(s) / UC Diagnoses   Final diagnoses:    Diarrhea of infectious origin     Discharge Instructions     We should have the PCR gastrointestinal pathogen panel back tomorrow or Saturday (if weather delays  the process)  Start on Metronidazole 500 twice daily and avoid heavy food and dairy until at least Saturday.    ED Prescriptions    Medication Sig Dispense Auth. Provider   metroNIDAZOLE (FLAGYL) 500 MG tablet Take 1 tablet (500 mg total) by mouth 2 (two) times daily. 14 tablet Robyn Haber, MD     I have reviewed the PDMP during this encounter.   Robyn Haber, MD 02/17/20 1241

## 2020-02-17 NOTE — ED Triage Notes (Signed)
C/O abdominal cramping since 2/12- seen here on 2/13 , negative covid test, continues to have low grade fever with diarrhea, min po intake, relief w/ zofran for N/V, diarrhea currently is granular, weight loss of 6 lbs in 6 days

## 2020-02-17 NOTE — Discharge Instructions (Addendum)
We should have the PCR gastrointestinal pathogen panel back tomorrow or Saturday (if weather delays the process)  Start on Metronidazole 500 twice daily and avoid heavy food and dairy until at least Saturday.

## 2020-02-18 LAB — GASTROINTESTINAL PATHOGEN PANEL PCR
C. difficile Tox A/B, PCR: NOT DETECTED
Campylobacter, PCR: NOT DETECTED
Cryptosporidium, PCR: NOT DETECTED
E coli (ETEC) LT/ST PCR: NOT DETECTED
E coli (STEC) stx1/stx2, PCR: NOT DETECTED
E coli 0157, PCR: NOT DETECTED
Giardia lamblia, PCR: NOT DETECTED
Norovirus, PCR: NOT DETECTED
Rotavirus A, PCR: NOT DETECTED
Salmonella, PCR: NOT DETECTED
Shigella, PCR: NOT DETECTED

## 2020-02-21 ENCOUNTER — Encounter: Payer: Self-pay | Admitting: Family Medicine

## 2020-02-27 ENCOUNTER — Other Ambulatory Visit: Payer: Self-pay | Admitting: Family Medicine

## 2020-02-27 DIAGNOSIS — M7021 Olecranon bursitis, right elbow: Secondary | ICD-10-CM

## 2020-03-11 ENCOUNTER — Other Ambulatory Visit: Payer: Self-pay | Admitting: Family Medicine

## 2020-03-11 DIAGNOSIS — E876 Hypokalemia: Secondary | ICD-10-CM

## 2020-03-24 ENCOUNTER — Ambulatory Visit (INDEPENDENT_AMBULATORY_CARE_PROVIDER_SITE_OTHER): Payer: 59 | Admitting: Family Medicine

## 2020-03-24 ENCOUNTER — Other Ambulatory Visit: Payer: Self-pay

## 2020-03-24 ENCOUNTER — Encounter: Payer: Self-pay | Admitting: Family Medicine

## 2020-03-24 VITALS — BP 134/78 | HR 72 | Temp 97.5°F | Ht 70.0 in | Wt 186.4 lb

## 2020-03-24 DIAGNOSIS — E538 Deficiency of other specified B group vitamins: Secondary | ICD-10-CM | POA: Diagnosis not present

## 2020-03-24 DIAGNOSIS — E7800 Pure hypercholesterolemia, unspecified: Secondary | ICD-10-CM | POA: Diagnosis not present

## 2020-03-24 DIAGNOSIS — E559 Vitamin D deficiency, unspecified: Secondary | ICD-10-CM

## 2020-03-24 DIAGNOSIS — I10 Essential (primary) hypertension: Secondary | ICD-10-CM | POA: Diagnosis not present

## 2020-03-24 DIAGNOSIS — R7309 Other abnormal glucose: Secondary | ICD-10-CM

## 2020-03-24 LAB — LIPID PANEL
Cholesterol: 148 mg/dL (ref 0–200)
HDL: 43.8 mg/dL (ref >39.00)
LDL Cholesterol: 83 mg/dL (ref 0–99)
NonHDL: 104.44
Total CHOL/HDL Ratio: 3
Triglycerides: 105 mg/dL (ref 0.0–149.0)
VLDL: 21 mg/dL (ref 0.0–40.0)

## 2020-03-24 LAB — URINALYSIS, ROUTINE W REFLEX MICROSCOPIC
Bilirubin Urine: NEGATIVE
Hgb urine dipstick: NEGATIVE
Ketones, ur: NEGATIVE
Leukocytes,Ua: NEGATIVE
Nitrite: NEGATIVE
Specific Gravity, Urine: 1.02 (ref 1.000–1.030)
Total Protein, Urine: NEGATIVE
Urine Glucose: NEGATIVE
Urobilinogen, UA: 0.2 (ref 0.0–1.0)
pH: 6 (ref 5.0–8.0)

## 2020-03-24 LAB — CBC
HCT: 37.9 % — ABNORMAL LOW (ref 39.0–52.0)
Hemoglobin: 13 g/dL (ref 13.0–17.0)
MCHC: 34.4 g/dL (ref 30.0–36.0)
MCV: 87 fl (ref 78.0–100.0)
Platelets: 148 10*3/uL — ABNORMAL LOW (ref 150.0–400.0)
RBC: 4.36 Mil/uL (ref 4.22–5.81)
RDW: 13.8 % (ref 11.5–15.5)
WBC: 3.5 10*3/uL — ABNORMAL LOW (ref 4.0–10.5)

## 2020-03-24 LAB — COMPREHENSIVE METABOLIC PANEL
ALT: 29 U/L (ref 0–53)
AST: 17 U/L (ref 0–37)
Albumin: 4.4 g/dL (ref 3.5–5.2)
Alkaline Phosphatase: 52 U/L (ref 39–117)
BUN: 23 mg/dL (ref 6–23)
CO2: 29 mEq/L (ref 19–32)
Calcium: 9.3 mg/dL (ref 8.4–10.5)
Chloride: 104 mEq/L (ref 96–112)
Creatinine, Ser: 1.22 mg/dL (ref 0.40–1.50)
GFR: 61.95 mL/min (ref >60.00)
Glucose, Bld: 118 mg/dL — ABNORMAL HIGH (ref 70–99)
Potassium: 3.6 mEq/L (ref 3.5–5.1)
Sodium: 140 mEq/L (ref 135–145)
Total Bilirubin: 0.7 mg/dL (ref 0.2–1.2)
Total Protein: 6.7 g/dL (ref 6.0–8.3)

## 2020-03-24 LAB — VITAMIN D 25 HYDROXY (VIT D DEFICIENCY, FRACTURES): VITD: 87.5 ng/mL (ref 30.00–100.00)

## 2020-03-24 LAB — TSH: TSH: 2.29 u[IU]/mL (ref 0.35–4.50)

## 2020-03-24 LAB — HEMOGLOBIN A1C: Hgb A1c MFr Bld: 5.9 % (ref 4.6–6.5)

## 2020-03-24 LAB — MICROALBUMIN / CREATININE URINE RATIO
Creatinine,U: 88 mg/dL
Microalb Creat Ratio: 0.8 mg/g (ref 0.0–30.0)
Microalb, Ur: <0.7 mg/dL (ref 0.0–1.9)

## 2020-03-24 LAB — VITAMIN B12: Vitamin B-12: 322 pg/mL (ref 211–911)

## 2020-03-24 LAB — LDL CHOLESTEROL, DIRECT: Direct LDL: 84 mg/dL

## 2020-03-24 MED ORDER — LOSARTAN POTASSIUM 100 MG PO TABS: 100.0000 mg | ORAL_TABLET | Freq: Every day | ORAL | 0 refills | Status: DC

## 2020-03-24 MED ORDER — LOSARTAN POTASSIUM 100 MG PO TABS: 100.0000 mg | ORAL_TABLET | Freq: Every day | ORAL | 3 refills | Status: DC

## 2020-03-24 NOTE — Progress Notes (Addendum)
Established Patient Office Visit  Subjective:  Patient ID: Henry Hernandez, male    DOB: May 04, 1966  Age: 54 y.o. MRN: [id number]  CC:  Chief Complaint  Patient presents with  . Follow-up    6 month follwo up on BP concerns about medications, kidney function and would like blood sugar checked today.     HPI Henry Hernandez presents for follow-up of his hypertension, elevated cholesterol, B12 deficiency, vitamin D deficiency.  Blood pressure has been running in the 130s over 80-90 range.  He is concerned.  He also had developed an acute diarrheal illness with a fever and had read that olmesartan has been associated with diarrhea.  He would like to make a change.  He is concerned about his renal function.  He is actually stopped the meloxicam for his chronic back pain because of this.  Continues potassium.  He has been able to lose a significant amount of weight.  Blood sugars at home have been running in the 10 2-1 25 range.  Hemoglobin A1c dressed through his company doctor was 5.4 last month.  Past Medical History:  Diagnosis Date  . Allergy   . Chicken pox   . Hyperlipidemia   . Hypertension     Past Surgical History:  Procedure Laterality Date  . BACK SURGERY      Family History  Problem Relation Age of Onset  . Arthritis Mother   . COPD Mother   . Diabetes Mother   . Heart attack Mother   . Heart disease Mother   . Hyperlipidemia Mother   . Hypertension Mother   . Kidney disease Mother   . Stroke Mother   . Hypertension Father   . Hypertension Sister   . Hypertension Brother   . Cancer Maternal Grandmother   . Hypertension Maternal Grandmother     Social History   Socioeconomic History  . Marital status: Married    Spouse name: Not on file  . Number of children: Not on file  . Years of education: Not on file  . Highest education level: Not on file  Occupational History  . Not on file  Tobacco Use  . Smoking status: Never Smoker  . Smokeless tobacco: Never  Used  Substance and Sexual Activity  . Alcohol use: Not Currently  . Drug use: Not Currently  . Sexual activity: Not on file  Other Topics Concern  . Not on file  Social History Narrative  . Not on file   Social Determinants of Health   Financial Resource Strain:   . Difficulty of Paying Living Expenses:   Food Insecurity:   . Worried About Programme researcher, broadcasting/film/video in the Last Year:   . Barista in the Last Year:   Transportation Needs:   . Freight forwarder (Medical):   [name] Lack of Transportation (Non-Medical):   Physical Activity:   . Days of Exercise per Week:   . Minutes of Exercise per Session:   Stress:   . Feeling of Stress :   Social Connections:   . Frequency of Communication with Friends and Family:   . Frequency of Social Gatherings with Friends and Family:   . Attends Religious Services:   . Active Member of Clubs or Organizations:   . Attends Banker Meetings:   [name] Marital Status:   Intimate Partner Violence:   . Fear of Current or Ex-Partner:   . Emotionally Abused:   [name] Physically Abused:   .  Sexually Abused:     Outpatient Medications Prior to Visit  Medication Sig Dispense Refill  . atorvastatin (LIPITOR) 20 MG tablet TAKE 1 TABLET BY MOUTH EVERY DAY 90 tablet 2  . b complex vitamins capsule Take 1 capsule by mouth daily. 90 capsule 1  . metoprolol succinate (TOPROL-XL) 25 MG 24 hr tablet TAKE 1 TABLET BY MOUTH EVERY DAY 90 tablet 2  . Vitamin D, Ergocalciferol, (DRISDOL) 1.25 MG (50000 UT) CAPS capsule TAKE ONE CAPSULE BY MOUTH ONE TIME PER WEEK 12 capsule 2  . KLOR-CON M20 20 MEQ tablet TAKE 1 TABLET BY MOUTH EVERY DAY 90 tablet 1  . olmesartan-hydrochlorothiazide (BENICAR HCT) 40-25 MG tablet TAKE 1 TABLET BY MOUTH EVERY DAY 90 tablet 3  . aspirin EC 81 MG tablet Take 1 tablet by mouth daily.    . meloxicam (MOBIC) 15 MG tablet TAKE 1 TABLET (15 MG TOTAL) BY MOUTH DAILY. FOR 10 DAYS AND THEN AS NEEDED WITH FOOD. (Patient not taking:  Reported on 03/24/2020) 30 tablet 0  . metroNIDAZOLE (FLAGYL) 500 MG tablet Take 1 tablet (500 mg total) by mouth 2 (two) times daily. (Patient not taking: Reported on 03/24/2020) 14 tablet 0  . ondansetron (ZOFRAN-ODT) 8 MG disintegrating tablet Take 1 tablet (8 mg total) by mouth every 8 (eight) hours as needed for nausea. (Patient not taking: Reported on 03/24/2020) 12 tablet 0  . triamcinolone ointment (KENALOG) 0.5 % Apply 1 application topically 2 (two) times daily. To rash on chest only. (Patient not taking: Reported on 03/24/2020) 30 g 0   No facility-administered medications prior to visit.    No Known Allergies  ROS Review of Systems  Constitutional: Negative.   HENT: Negative.   Eyes: Negative for photophobia and visual disturbance.  Respiratory: Negative.   Cardiovascular: Negative.   Gastrointestinal: Negative.   Endocrine: Negative for polyphagia and polyuria.  Genitourinary: Negative.   Musculoskeletal: Negative.   Skin: Negative for pallor and rash.  Allergic/Immunologic: Negative for immunocompromised state.  Neurological: Negative for light-headedness and numbness.  Hematological: Does not bruise/bleed easily.  Psychiatric/Behavioral: Negative.       Objective:    Physical Exam  Constitutional: He is oriented to person, place, and time. He appears well-developed and well-nourished. No distress.  HENT:  Head: Normocephalic and atraumatic.  Right Ear: External ear normal.  Left Ear: External ear normal.  Eyes: Conjunctivae are normal. Right eye exhibits no discharge. Left eye exhibits no discharge. No scleral icterus.  Neck: No JVD present. No tracheal deviation present. No thyromegaly present.  Cardiovascular: Normal rate and normal heart sounds.  Pulmonary/Chest: Effort normal and breath sounds normal. No stridor.  Abdominal: Soft. Bowel sounds are normal. He exhibits no distension. There is no abdominal tenderness. There is no rebound and no guarding.    Lymphadenopathy:    He has no cervical adenopathy.  Neurological: He is alert and oriented to person, place, and time.  Skin: Skin is warm and dry. He is not diaphoretic.  Psychiatric: He has a normal mood and affect. His behavior is normal.    BP 134/78   Pulse 72   Temp (!) 97.5 F (36.4 C) (Tympanic)   Ht 5\' 10"  (1.778 m)   Wt 186 lb 6.4 oz (84.6 kg)   SpO2 97%   BMI 26.75 kg/m  Wt Readings from Last 3 Encounters:  03/24/20 186 lb 6.4 oz (84.6 kg)  02/17/20 182 lb (82.6 kg)  02/12/20 183 lb (83 kg)     There  are no preventive care reminders to display for this patient.  There are no preventive care reminders to display for this patient.  Lab Results  Component Value Date   TSH 2.29 03/24/2020   Lab Results  Component Value Date   WBC 3.5 (L) 03/24/2020   HGB 13.0 03/24/2020   HCT 37.9 (L) 03/24/2020   MCV 87.0 03/24/2020   PLT 148.0 (L) 03/24/2020   Lab Results  Component Value Date   NA 140 03/24/2020   K 3.6 03/24/2020   CO2 29 03/24/2020   GLUCOSE 118 (H) 03/24/2020   BUN 23 03/24/2020   CREATININE 1.22 03/24/2020   BILITOT 0.7 03/24/2020   ALKPHOS 52 03/24/2020   AST 17 03/24/2020   ALT 29 03/24/2020   PROT 6.7 03/24/2020   ALBUMIN 4.4 03/24/2020   CALCIUM 9.3 03/24/2020   GFR 61.95 03/24/2020   Lab Results  Component Value Date   CHOL 148 03/24/2020   Lab Results  Component Value Date   HDL 43.80 03/24/2020   Lab Results  Component Value Date   LDLCALC 83 03/24/2020   Lab Results  Component Value Date   TRIG 105.0 03/24/2020   Lab Results  Component Value Date   CHOLHDL 3 03/24/2020   Lab Results  Component Value Date   HGBA1C 5.9 03/24/2020      Assessment & Plan:   Problem List Items Addressed This Visit      Cardiovascular and Mediastinum   Essential hypertension - Primary   Relevant Medications   losartan (COZAAR) 100 MG tablet   Other Relevant Orders   CBC (Completed)   Comprehensive metabolic panel (Completed)    Urinalysis, Routine w reflex microscopic (Completed)   Microalbumin / creatinine urine ratio (Completed)   TSH (Completed)     Other   Elevated cholesterol   Relevant Medications   losartan (COZAAR) 100 MG tablet   Other Relevant Orders   LDL cholesterol, direct (Completed)   Comprehensive metabolic panel (Completed)   Lipid panel (Completed)   TSH (Completed)   Vitamin D deficiency   Relevant Orders   VITAMIN D 25 Hydroxy (Vit-D Deficiency, Fractures) (Completed)   Elevated glucose   Relevant Orders   Comprehensive metabolic panel (Completed)   Hemoglobin A1c (Completed)   B12 deficiency   Relevant Medications   cyanocobalamin (,VITAMIN B-12,) 1000 MCG/ML injection   Other Relevant Orders   Vitamin B12 (Completed)      Meds ordered this encounter  Medications  . DISCONTD: losartan (COZAAR) 100 MG tablet    Sig: Take 1 tablet (100 mg total) by mouth daily.    Dispense:  90 tablet    Refill:  3  . losartan (COZAAR) 100 MG tablet    Sig: Take 1 tablet (100 mg total) by mouth daily.    Dispense:  90 tablet    Refill:  0  . cyanocobalamin (,VITAMIN B-12,) 1000 MCG/ML injection    Sig: Inject 1 mL (1,000 mcg total) into the muscle once for 1 dose.    Dispense:  6 mL    Refill:  2    Follow-up: Return in about 1 month (around 04/24/2020).  Patient will stop Benicar with HCTZ we will start losartan 100 mg daily.  He will follow-up in 1 month.  He will stop his potassium.  Discussed his GFR.  Discussed that it is it is normal for it to decline with age.  He is on an appropriate medicine to counteract that decline.  We will  follow his blood pressure closely and he will follow-up in 1 month.  Reminded him that meloxicam is also metabolized through the liver.  He said that he will try perhaps standing Tylenol or aspirin for his chronic back pain.  Mliss Sax, MD

## 2020-03-30 ENCOUNTER — Other Ambulatory Visit: Payer: Self-pay | Admitting: Family Medicine

## 2020-03-30 MED ORDER — CYANOCOBALAMIN 1000 MCG/ML IJ SOLN: 1000.0000 ug | Freq: Once | INTRAMUSCULAR | 2 refills | Status: AC

## 2020-03-30 NOTE — Addendum Note (Signed)
Addended by: Andrez Grime on: 03/30/2020 04:35 PM   Modules accepted: Orders

## 2020-04-24 ENCOUNTER — Encounter: Payer: Self-pay | Admitting: Family Medicine

## 2020-04-24 ENCOUNTER — Ambulatory Visit (INDEPENDENT_AMBULATORY_CARE_PROVIDER_SITE_OTHER): Payer: 59 | Admitting: Family Medicine

## 2020-04-24 ENCOUNTER — Other Ambulatory Visit: Payer: Self-pay

## 2020-04-24 VITALS — BP 152/84 | HR 63 | Temp 97.7°F | Ht 70.0 in | Wt 188.4 lb

## 2020-04-24 DIAGNOSIS — I10 Essential (primary) hypertension: Secondary | ICD-10-CM

## 2020-04-24 DIAGNOSIS — F341 Dysthymic disorder: Secondary | ICD-10-CM | POA: Diagnosis not present

## 2020-04-24 DIAGNOSIS — F419 Anxiety disorder, unspecified: Secondary | ICD-10-CM

## 2020-04-24 MED ORDER — OLMESARTAN-AMLODIPINE-HCTZ 40-10-25 MG PO TABS: 1.0000 | ORAL_TABLET | Freq: Every day | ORAL | 2 refills | Status: DC

## 2020-04-24 MED ORDER — OLMESARTAN MEDOXOMIL-HCTZ 40-25 MG PO TABS: 1.0000 | ORAL_TABLET | Freq: Every day | ORAL | 1 refills | Status: DC

## 2020-04-24 MED ORDER — FLUOXETINE HCL 10 MG PO CAPS: 10.0000 mg | ORAL_CAPSULE | Freq: Every day | ORAL | 1 refills | Status: DC

## 2020-04-24 NOTE — Addendum Note (Signed)
Addended by: Andrez Grime on: 04/24/2020 09:12 AM   Modules accepted: Orders

## 2020-04-24 NOTE — Progress Notes (Addendum)
Established Patient Office Visit  Subjective:  Patient ID: Henry Hernandez, male    DOB: 1966/08/29  Age: 54 y.o. MRN: [id number]  CC:  Chief Complaint  Patient presents with  . Follow-up    1 month follow up on BP and medications. [name] states that meds are not working well.     HPI [name] presents for follow-up of his hypertension.  Blood pressure running in the low 150s over 90s range with a change to losartan.  Text the low-dose metoprolol for skipped heartbeats and rare bouts of tachycardia.  It works well for him.  Now believes that the olmesartan had not been associated with his diarrheal illness as I had suggested.  He is stressed.  Busy filling prescriptions and giving multiple vaccines daily.  Past Medical History:  Diagnosis Date  . Allergy   . Chicken pox   . Hyperlipidemia   . Hypertension     Past Surgical History:  Procedure Laterality Date  . BACK SURGERY      Family History  Problem Relation Age of Onset  . Arthritis Mother   . COPD Mother   . Diabetes Mother   . Heart attack Mother   . Heart disease Mother   . Hyperlipidemia Mother   . Hypertension Mother   . Kidney disease Mother   . Stroke Mother   . Hypertension Father   . Hypertension Sister   . Hypertension Brother   . Cancer Maternal Grandmother   . Hypertension Maternal Grandmother     Social History   Socioeconomic History  . Marital status: Married    Spouse name: Not on file  . Number of children: Not on file  . Years of education: Not on file  . Highest education level: Not on file  Occupational History  . Not on file  Tobacco Use  . Smoking status: Never Smoker  . Smokeless tobacco: Never Used  Substance and Sexual Activity  . Alcohol use: Not Currently  . Drug use: Not Currently  . Sexual activity: Not on file  Other Topics Concern  . Not on file  Social History Narrative  . Not on file   Social Determinants of Health   Financial Resource Strain:   .  Difficulty of Paying Living Expenses:   Food Insecurity:   . Worried About Charity fundraiser in the Last Year:   . Arboriculturist in the Last Year:   Transportation Needs:   . Film/video editor (Medical):   [name] Lack of Transportation (Non-Medical):   Physical Activity:   . Days of Exercise per Week:   . Minutes of Exercise per Session:   Stress:   . Feeling of Stress :   Social Connections:   . Frequency of Communication with Friends and Family:   . Frequency of Social Gatherings with Friends and Family:   . Attends Religious Services:   . Active Member of Clubs or Organizations:   . Attends Archivist Meetings:   [name] Marital Status:   Intimate Partner Violence:   . Fear of Current or Ex-Partner:   . Emotionally Abused:   [name] Physically Abused:   . Sexually Abused:     Outpatient Medications Prior to Visit  Medication Sig Dispense Refill  . aspirin EC 81 MG tablet Take 1 tablet by mouth daily.    [name] atorvastatin (LIPITOR) 20 MG tablet TAKE 1 TABLET BY MOUTH EVERY DAY 90 tablet 2  . cyanocobalamin (,VITAMIN  B-12,) 1000 MCG/ML injection INJECT 1 ML (1,000 MCG TOTAL) INTO THE MUSCLE ONCE FOR 1 DOSE.    . meloxicam (MOBIC) 15 MG tablet TAKE 1 TABLET (15 MG TOTAL) BY MOUTH DAILY. FOR 10 DAYS AND THEN AS NEEDED WITH FOOD.    [name] metoprolol succinate (TOPROL-XL) 25 MG 24 hr tablet TAKE 1 TABLET BY MOUTH EVERY DAY 90 tablet 2  . losartan (COZAAR) 100 MG tablet Take 1 tablet (100 mg total) by mouth daily. 90 tablet 0  . Vitamin D, Ergocalciferol, (DRISDOL) 1.25 MG (50000 UT) CAPS capsule TAKE ONE CAPSULE BY MOUTH ONE TIME PER WEEK (Patient not taking: Reported on 04/24/2020) 12 capsule 2   No facility-administered medications prior to visit.    No Known Allergies  ROS Review of Systems  Constitutional: Negative.   HENT: Negative.   Eyes: Negative for photophobia and visual disturbance.  Respiratory: Negative.   Cardiovascular: Negative.  Negative for chest pain and  palpitations.  Gastrointestinal: Negative.   Endocrine: Negative for polyphagia and polyuria.  Genitourinary: Negative.   Musculoskeletal: Positive for back pain. Negative for joint swelling.  Allergic/Immunologic: Negative for immunocompromised state.  Neurological: Negative for light-headedness and numbness.  Hematological: Does not bruise/bleed easily.  Psychiatric/Behavioral: Positive for dysphoric mood. The patient is nervous/anxious.        Depression screen [facility] 2/9 04/24/2020 03/24/2020  Decreased Interest 1 1  Down, Depressed, Hopeless 1 1  PHQ - 2 Score 2 2  Altered sleeping 0 -  Tired, decreased energy 2 -  Change in appetite 2 -  Feeling bad or failure about yourself  0 -  Trouble concentrating 1 -  Moving slowly or fidgety/restless 3 -  Suicidal thoughts 1 -  PHQ-9 Score 11 -  Difficult doing work/chores Not difficult at all -    Objective:    Physical Exam  Constitutional: He is oriented to person, place, and time. He appears well-developed and well-nourished. No distress.  HENT:  Head: Normocephalic and atraumatic.  Right Ear: External ear normal.  Left Ear: External ear normal.  Eyes: Conjunctivae are normal. Right eye exhibits no discharge. Left eye exhibits no discharge. No scleral icterus.  Neck: No JVD present. No tracheal deviation present.  Cardiovascular: Normal rate, regular rhythm and normal heart sounds.  Pulmonary/Chest: Effort normal and breath sounds normal. No stridor.  Abdominal: Bowel sounds are normal.  Musculoskeletal:        General: No edema.  Neurological: He is alert and oriented to person, place, and time.  Skin: Skin is warm and dry. He is not diaphoretic.  Psychiatric: He has a normal mood and affect. His behavior is normal.    BP (!) 152/84   Pulse 63   Temp 97.7 F (36.5 C) (Tympanic)   Ht 5\' 10"  (1.778 m)   Wt 188 lb 6.4 oz (85.5 kg)   SpO2 97%   BMI 27.03 kg/m  Wt Readings from Last 3 Encounters:  04/24/20 188 lb 6.4  oz (85.5 kg)  03/24/20 186 lb 6.4 oz (84.6 kg)  02/17/20 182 lb (82.6 kg)     Health Maintenance Due  Topic Date Due  . COVID-19 Vaccine (1) Never done    There are no preventive care reminders to display for this patient.  Lab Results  Component Value Date   TSH 2.29 03/24/2020   Lab Results  Component Value Date   WBC 3.5 (L) 03/24/2020   HGB 13.0 03/24/2020   HCT 37.9 (L) 03/24/2020   MCV 87.0  03/24/2020   PLT 148.0 (L) 03/24/2020   Lab Results  Component Value Date   NA 140 03/24/2020   K 3.6 03/24/2020   CO2 29 03/24/2020   GLUCOSE 118 (H) 03/24/2020   BUN 23 03/24/2020   CREATININE 1.22 03/24/2020   BILITOT 0.7 03/24/2020   ALKPHOS 52 03/24/2020   AST 17 03/24/2020   ALT 29 03/24/2020   PROT 6.7 03/24/2020   ALBUMIN 4.4 03/24/2020   CALCIUM 9.3 03/24/2020   GFR 61.95 03/24/2020   Lab Results  Component Value Date   CHOL 148 03/24/2020   Lab Results  Component Value Date   HDL 43.80 03/24/2020   Lab Results  Component Value Date   LDLCALC 83 03/24/2020   Lab Results  Component Value Date   TRIG 105.0 03/24/2020   Lab Results  Component Value Date   CHOLHDL 3 03/24/2020   Lab Results  Component Value Date   HGBA1C 5.9 03/24/2020      Assessment & Plan:   Problem List Items Addressed This Visit      Cardiovascular and Mediastinum   Essential hypertension   Relevant Medications   olmesartan-hydrochlorothiazide (BENICAR HCT) 40-25 MG tablet     Other   Anxiety   Relevant Medications   FLUoxetine (PROZAC) 10 MG capsule   Dysthymia - Primary   Relevant Medications   FLUoxetine (PROZAC) 10 MG capsule      Meds ordered this encounter  Medications  . DISCONTD: Olmesartan-amLODIPine-HCTZ 40-10-25 MG TABS    Sig: Take 1 tablet by mouth daily.    Dispense:  90 tablet    Refill:  2  . FLUoxetine (PROZAC) 10 MG capsule    Sig: Take 1 capsule (10 mg total) by mouth daily.    Dispense:  30 capsule    Refill:  1  .  olmesartan-hydrochlorothiazide (BENICAR HCT) 40-25 MG tablet    Sig: Take 1 tablet by mouth daily.    Dispense:  90 tablet    Refill:  1    Follow-up: Return in about 5 weeks (around 05/29/2020).  Has switched back to olmesartan with HCTZ 40/25.  Patient agrees to give Prozac a try at low dose.  Given information on managing hypertension.  Mliss Sax, MD

## 2020-04-24 NOTE — Patient Instructions (Signed)
Preventing Hypertension Hypertension, commonly called high blood pressure, is when the force of blood pumping through the arteries is too strong. Arteries are blood vessels that carry blood from the heart throughout the body. Over time, hypertension can damage the arteries and decrease blood flow to important parts of the body, including the brain, heart, and kidneys. Often, hypertension does not cause symptoms until blood pressure is very high. For this reason, it is important to have your blood pressure checked on a regular basis. Hypertension can often be prevented with diet and lifestyle changes. If you already have hypertension, you can control it with diet and lifestyle changes, as well as medicine. What nutrition changes can be made? Maintain a healthy diet. This includes:  Eating less salt (sodium). Ask your health care provider how much sodium is safe for you to have. The general recommendation is to consume less than 1 tsp (2,300 mg) of sodium a day. ? Do not add salt to your food. ? Choose low-sodium options when grocery shopping and eating out.  Limiting fats in your diet. You can do this by eating low-fat or fat-free dairy products and by eating less red meat.  Eating more fruits, vegetables, and whole grains. Make a goal to eat: ? 1-2 cups of fresh fruits and vegetables each day. ? 3-4 servings of whole grains each day.  Avoiding foods and beverages that have added sugars.  Eating fish that contain healthy fats (omega-3 fatty acids), such as mackerel or salmon. If you need help putting together a healthy eating plan, try the DASH diet. This diet is high in fruits, vegetables, and whole grains. It is low in sodium, red meat, and added sugars. DASH stands for Dietary Approaches to Stop Hypertension. What lifestyle changes can be made?   Lose weight if you are overweight. Losing just 3?5% of your body weight can help prevent or control hypertension. ? For example, if your present  weight is 200 lb (91 kg), a loss of 3-5% of your weight means losing 6-10 lb (2.7-4.5 kg). ? Ask your health care provider to help you with a diet and exercise plan to safely lose weight.  Get enough exercise. Do at least 150 minutes of moderate-intensity exercise each week. ? You could do this in short exercise sessions several times a day, or you could do longer exercise sessions a few times a week. For example, you could take a brisk 10-minute walk or bike ride, 3 times a day, for 5 days a week.  Find ways to reduce stress, such as exercising, meditating, listening to music, or taking a yoga class. If you need help reducing stress, ask your health care provider.  Do not smoke. This includes e-cigarettes. Chemicals in tobacco and nicotine products raise your blood pressure each time you smoke. If you need help quitting, ask your health care provider.  Avoid alcohol. If you drink alcohol, limit alcohol intake to no more than 1 drink a day for nonpregnant women and 2 drinks a day for men. One drink equals 12 oz of beer, 5 oz of wine, or 1 oz of hard liquor. Why are these changes important? Diet and lifestyle changes can help you prevent hypertension, and they may make you feel better overall and improve your quality of life. If you have hypertension, making these changes will help you control it and help prevent major complications, such as:  Hardening and narrowing of arteries that supply blood to: ? Your heart. This can cause a heart  attack. ? Your brain. This can cause a stroke. ? Your kidneys. This can cause kidney failure.  Stress on your heart muscle, which can cause heart failure. What can I do to lower my risk?  Work with your health care provider to make a hypertension prevention plan that works for you. Follow your plan and keep all follow-up visits as told by your health care provider.  Learn how to check your blood pressure at home. Make sure that you know your personal target  blood pressure, as told by your health care provider. How is this treated? In addition to diet and lifestyle changes, your health care provider may recommend medicines to help lower your blood pressure. You may need to try a few different medicines to find what works best for you. You also may need to take more than one medicine. Take over-the-counter and prescription medicines only as told by your health care provider. Where to find support Your health care provider can help you prevent hypertension and help you keep your blood pressure at a healthy level. Your local hospital or your community may also provide support services and prevention programs. The American Heart Association offers an online support network at: https://www.lee.net/ Where to find more information Learn more about hypertension from:  National Heart, Lung, and Blood Institute: https://www.peterson.org/  Centers for Disease Control and Prevention: AboutHD.co.nz  American Academy of Family Physicians: http://familydoctor.org/familydoctor/en/diseases-conditions/high-blood-pressure.printerview.all.html Learn more about the DASH diet from:  National Heart, Lung, and Blood Institute: WedMap.it Contact a health care provider if:  You think you are having a reaction to medicines you have taken.  You have recurrent headaches or feel dizzy.  You have swelling in your ankles.  You have trouble with your vision. Summary  Hypertension often does not cause any symptoms until blood pressure is very high. It is important to get your blood pressure checked regularly.  Diet and lifestyle changes are the most important steps in preventing hypertension.  By keeping your blood pressure in a healthy range, you can prevent complications like heart attack, heart failure, stroke, and kidney failure.  Work with your health care  provider to make a hypertension prevention plan that works for you. This information is not intended to replace advice given to you by your health care provider. Make sure you discuss any questions you have with your health care provider. Document Revised: 04/09/2019 Document Reviewed: 08/26/2016 Elsevier Patient Education  2020 [employer].  Managing Your Hypertension Hypertension is commonly called high blood pressure. This is when the force of your blood pressing against the walls of your arteries is too strong. Arteries are blood vessels that carry blood from your heart throughout your body. Hypertension forces the heart to work harder to pump blood, and may cause the arteries to become narrow or stiff. Having untreated or uncontrolled hypertension can cause heart attack, stroke, kidney disease, and other problems. What are blood pressure readings? A blood pressure reading consists of a higher number over a lower number. Ideally, your blood pressure should be below 120/80. The first ("top") number is called the systolic pressure. It is a measure of the pressure in your arteries as your heart beats. The second ("bottom") number is called the diastolic pressure. It is a measure of the pressure in your arteries as the heart relaxes. What does my blood pressure reading mean? Blood pressure is classified into four stages. Based on your blood pressure reading, your health care provider may use the following stages to determine what type  of treatment you need, if any. Systolic pressure and diastolic pressure are measured in a unit called mm Hg. Normal  Systolic pressure: below 601.  Diastolic pressure: below 80. Elevated  Systolic pressure: 093-235.  Diastolic pressure: below 80. Hypertension stage 1  Systolic pressure: 573-220.  Diastolic pressure: 25-42. Hypertension stage 2  Systolic pressure: 706 or above.  Diastolic pressure: 90 or above. What health risks are associated with  hypertension? Managing your hypertension is an important responsibility. Uncontrolled hypertension can lead to:  A heart attack.  A stroke.  A weakened blood vessel (aneurysm).  Heart failure.  Kidney damage.  Eye damage.  Metabolic syndrome.  Memory and concentration problems. What changes can I make to manage my hypertension? Hypertension can be managed by making lifestyle changes and possibly by taking medicines. Your health care provider will help you make a plan to bring your blood pressure within a normal range. Eating and drinking   Eat a diet that is high in fiber and potassium, and low in salt (sodium), added sugar, and fat. An example eating plan is called the DASH (Dietary Approaches to Stop Hypertension) diet. To eat this way: ? Eat plenty of fresh fruits and vegetables. Try to fill half of your plate at each meal with fruits and vegetables. ? Eat whole grains, such as whole wheat pasta, brown rice, or whole grain bread. Fill about one quarter of your plate with whole grains. ? Eat low-fat diary products. ? Avoid fatty cuts of meat, processed or cured meats, and poultry with skin. Fill about one quarter of your plate with lean proteins such as fish, chicken without skin, beans, eggs, and tofu. ? Avoid premade and processed foods. These tend to be higher in sodium, added sugar, and fat.  Reduce your daily sodium intake. Most people with hypertension should eat less than 1,500 mg of sodium a day.  Limit alcohol intake to no more than 1 drink a day for nonpregnant women and 2 drinks a day for men. One drink equals 12 oz of beer, 5 oz of wine, or 1 oz of hard liquor. Lifestyle  Work with your health care provider to maintain a healthy body weight, or to lose weight. Ask what an ideal weight is for you.  Get at least 30 minutes of exercise that causes your heart to beat faster (aerobic exercise) most days of the week. Activities may include walking, swimming, or  biking.  Include exercise to strengthen your muscles (resistance exercise), such as weight lifting, as part of your weekly exercise routine. Try to do these types of exercises for 30 minutes at least 3 days a week.  Do not use any products that contain nicotine or tobacco, such as cigarettes and e-cigarettes. If you need help quitting, ask your health care provider.  Control any long-term (chronic) conditions you have, such as high cholesterol or diabetes. Monitoring  Monitor your blood pressure at home as told by your health care provider. Your personal target blood pressure may vary depending on your medical conditions, your age, and other factors.  Have your blood pressure checked regularly, as often as told by your health care provider. Working with your health care provider  Review all the medicines you take with your health care provider because there may be side effects or interactions.  Talk with your health care provider about your diet, exercise habits, and other lifestyle factors that may be contributing to hypertension.  Visit your health care provider regularly. Your health care provider  can help you create and adjust your plan for managing hypertension. Will I need medicine to control my blood pressure? Your health care provider may prescribe medicine if lifestyle changes are not enough to get your blood pressure under control, and if:  Your systolic blood pressure is 130 or higher.  Your diastolic blood pressure is 80 or higher. Take medicines only as told by your health care provider. Follow the directions carefully. Blood pressure medicines must be taken as prescribed. The medicine does not work as well when you skip doses. Skipping doses also puts you at risk for problems. Contact a health care provider if:  You think you are having a reaction to medicines you have taken.  You have repeated (recurrent) headaches.  You feel dizzy.  You have swelling in your  ankles.  You have trouble with your vision. Get help right away if:  You develop a severe headache or confusion.  You have unusual weakness or numbness, or you feel faint.  You have severe pain in your chest or abdomen.  You vomit repeatedly.  You have trouble breathing. Summary  Hypertension is when the force of blood pumping through your arteries is too strong. If this condition is not controlled, it may put you at risk for serious complications.  Your personal target blood pressure may vary depending on your medical conditions, your age, and other factors. For most people, a normal blood pressure is less than 120/80.  Hypertension is managed by lifestyle changes, medicines, or both. Lifestyle changes include weight loss, eating a healthy, low-sodium diet, exercising more, and limiting alcohol. This information is not intended to replace advice given to you by your health care provider. Make sure you discuss any questions you have with your health care provider. Document Revised: 04/09/2019 Document Reviewed: 11/13/2016 Elsevier Patient Education  2020 [employer].

## 2020-05-30 ENCOUNTER — Ambulatory Visit (INDEPENDENT_AMBULATORY_CARE_PROVIDER_SITE_OTHER): Payer: No Typology Code available for payment source | Admitting: Family Medicine

## 2020-05-30 ENCOUNTER — Encounter: Payer: Self-pay | Admitting: Family Medicine

## 2020-05-30 ENCOUNTER — Other Ambulatory Visit: Payer: Self-pay

## 2020-05-30 VITALS — BP 136/84 | HR 59 | Temp 97.9°F | Resp 18 | Wt 189.0 lb

## 2020-05-30 DIAGNOSIS — F419 Anxiety disorder, unspecified: Secondary | ICD-10-CM | POA: Diagnosis not present

## 2020-05-30 DIAGNOSIS — F341 Dysthymic disorder: Secondary | ICD-10-CM | POA: Diagnosis not present

## 2020-05-30 DIAGNOSIS — I10 Essential (primary) hypertension: Secondary | ICD-10-CM | POA: Diagnosis not present

## 2020-05-30 MED ORDER — METOPROLOL SUCCINATE ER 25 MG PO TB24: 25.0000 mg | ORAL_TABLET | Freq: Every day | ORAL | 2 refills | Status: DC

## 2020-05-30 MED ORDER — FLUOXETINE HCL 10 MG PO CAPS: 20.0000 mg | ORAL_CAPSULE | Freq: Every day | ORAL | 2 refills | Status: DC

## 2020-05-30 NOTE — Patient Instructions (Signed)
Managing Anxiety, Adult After being diagnosed with an anxiety disorder, you may be relieved to know why you have felt or behaved a certain way. You may also feel overwhelmed about the treatment ahead and what it will mean for your life. With care and support, you can manage this condition and recover from it. How to manage lifestyle changes Managing stress and anxiety  Stress is your body's reaction to life changes and events, both good and bad. Most stress will last just a few hours, but stress can be ongoing and can lead to more than just stress. Although stress can play a major role in anxiety, it is not the same as anxiety. Stress is usually caused by something external, such as a deadline, test, or competition. Stress normally passes after the triggering event has ended.  Anxiety is caused by something internal, such as imagining a terrible outcome or worrying that something will go wrong that will devastate you. Anxiety often does not go away even after the triggering event is over, and it can become long-term (chronic) worry. It is important to understand the differences between stress and anxiety and to manage your stress effectively so that it does not lead to an anxious response. Talk with your health care provider or a counselor to learn more about reducing anxiety and stress. He or she may suggest tension reduction techniques, such as:  Music therapy. This can include creating or listening to music that you enjoy and that inspires you.  Mindfulness-based meditation. This involves being aware of your normal breaths while not trying to control your breathing. It can be done while sitting or walking.  Centering prayer. This involves focusing on a word, phrase, or sacred image that means something to you and brings you peace.  Deep breathing. To do this, expand your stomach and inhale slowly through your nose. Hold your breath for 3-5 seconds. Then exhale slowly, letting your stomach muscles  relax.  Self-talk. This involves identifying thought patterns that lead to anxiety reactions and changing those patterns.  Muscle relaxation. This involves tensing muscles and then relaxing them. Choose a tension reduction technique that suits your lifestyle and personality. These techniques take time and practice. Set aside 5-15 minutes a day to do them. Therapists can offer counseling and training in these techniques. The training to help with anxiety may be covered by some insurance plans. Other things you can do to manage stress and anxiety include:  Keeping a stress/anxiety diary. This can help you learn what triggers your reaction and then learn ways to manage your response.  Thinking about how you react to certain situations. You may not be able to control everything, but you can control your response.  Making time for activities that help you relax and not feeling guilty about spending your time in this way.  Visual imagery and yoga can help you stay calm and relax.  Medicines Medicines can help ease symptoms. Medicines for anxiety include:  Anti-anxiety drugs.  Antidepressants. Medicines are often used as a primary treatment for anxiety disorder. Medicines will be prescribed by a health care provider. When used together, medicines, psychotherapy, and tension reduction techniques may be the most effective treatment. Relationships Relationships can play a big part in helping you recover. Try to spend more time connecting with trusted friends and family members. Consider going to couples counseling, taking family education classes, or going to family therapy. Therapy can help you and others better understand your condition. How to recognize changes in your   anxiety Everyone responds differently to treatment for anxiety. Recovery from anxiety happens when symptoms decrease and stop interfering with your daily activities at home or work. This may mean that you will start to:  Have  better concentration and focus. Worry will interfere less in your daily thinking.  Sleep better.  Be less irritable.  Have more energy.  Have improved memory. It is important to recognize when your condition is getting worse. Contact your health care provider if your symptoms interfere with home or work and you feel like your condition is not improving. Follow these instructions at home: Activity  Exercise. Most adults should do the following: ? Exercise for at least 150 minutes each week. The exercise should increase your heart rate and make you sweat (moderate-intensity exercise). ? Strengthening exercises at least twice a week.  Get the right amount and quality of sleep. Most adults need 7-9 hours of sleep each night. Lifestyle   Eat a healthy diet that includes plenty of vegetables, fruits, whole grains, low-fat dairy products, and lean protein. Do not eat a lot of foods that are high in solid fats, added sugars, or salt.  Make choices that simplify your life.  Do not use any products that contain nicotine or tobacco, such as cigarettes, e-cigarettes, and chewing tobacco. If you need help quitting, ask your health care provider.  Avoid caffeine, alcohol, and certain over-the-counter cold medicines. These may make you feel worse. Ask your pharmacist which medicines to avoid. General instructions  Take over-the-counter and prescription medicines only as told by your health care provider.  Keep all follow-up visits as told by your health care provider. This is important. Where to find support You can get help and support from these sources:  Self-help groups.  Online and community organizations.  A trusted spiritual leader.  Couples counseling.  Family education classes.  Family therapy. Where to find more information You may find that joining a support group helps you deal with your anxiety. The following sources can help you locate counselors or support groups near  you:  Mental Health America: www.mentalhealthamerica.net  Anxiety and Depression Association of America (ADAA): www.adaa.org  National Alliance on Mental Illness (NAMI): www.nami.org Contact a health care provider if you:  Have a hard time staying focused or finishing daily tasks.  Spend many hours a day feeling worried about everyday life.  Become exhausted by worry.  Start to have headaches, feel tense, or have nausea.  Urinate more than normal.  Have diarrhea. Get help right away if you have:  A racing heart and shortness of breath.  Thoughts of hurting yourself or others. If you ever feel like you may hurt yourself or others, or have thoughts about taking your own life, get help right away. You can go to your nearest emergency department or call:  Your local emergency services (911 in the [place]  A suicide crisis helpline, such as the National Suicide Prevention Lifeline at 1-800-273-8255. This is open 24 hours a day. Summary  Taking steps to learn and use tension reduction techniques can help calm you and help prevent triggering an anxiety reaction.  When used together, medicines, psychotherapy, and tension reduction techniques may be the most effective treatment.  Family, friends, and partners can play a big part in helping you recover from an anxiety disorder. This information is not intended to replace advice given to you by your health care provider. Make sure you discuss any questions you have with your health care provider. Document Revised:   05/18/2019 Document Reviewed: 05/18/2019 Elsevier Patient Education  2020 [employer].  Exercising to Stay Healthy To become healthy and stay healthy, it is recommended that you do moderate-intensity and vigorous-intensity exercise. You can tell that you are exercising at a moderate intensity if your heart starts beating faster and you start breathing faster but can still hold a conversation. You can tell that you are  exercising at a vigorous intensity if you are breathing much harder and faster and cannot hold a conversation while exercising. Exercising regularly is important. It has many health benefits, such as:  Improving overall fitness, flexibility, and endurance.  Increasing bone density.  Helping with weight control.  Decreasing body fat.  Increasing muscle strength.  Reducing stress and tension.  Improving overall health. How often should I exercise? Choose an activity that you enjoy, and set realistic goals. Your health care provider can help you make an activity plan that works for you. Exercise regularly as told by your health care provider. This may include:  Doing strength training two times a week, such as: ? Lifting weights. ? Using resistance bands. ? Push-ups. ? Sit-ups. ? Yoga.  Doing a certain intensity of exercise for a given amount of time. Choose from these options: ? A total of 150 minutes of moderate-intensity exercise every week. ? A total of 75 minutes of vigorous-intensity exercise every week. ? A mix of moderate-intensity and vigorous-intensity exercise every week. Children, pregnant women, people who have not exercised regularly, people who are overweight, and older adults may need to talk with a health care provider about what activities are safe to do. If you have a medical condition, be sure to talk with your health care provider before you start a new exercise program. What are some exercise ideas? Moderate-intensity exercise ideas include:  Walking 1 mile (1.6 km) in about 15 minutes.  Biking.  Hiking.  Golfing.  Dancing.  Water aerobics. Vigorous-intensity exercise ideas include:  Walking 4.5 miles (7.2 km) or more in about 1 hour.  Jogging or running 5 miles (8 km) in about 1 hour.  Biking 10 miles (16.1 km) or more in about 1 hour.  Lap swimming.  Roller-skating or in-line skating.  Cross-country skiing.  Vigorous competitive sports,  such as football, basketball, and soccer.  Jumping rope.  Aerobic dancing. What are some everyday activities that can help me to get exercise?  Yard work, such as: ? Pushing a Surveyor, mining. ? Raking and bagging leaves.  Washing your car.  Pushing a stroller.  Shoveling snow.  Gardening.  Washing windows or floors. How can I be more active in my day-to-day activities?  Use stairs instead of an elevator.  Take a walk during your lunch break.  If you drive, park your car farther away from your work or school.  If you take public transportation, get off one stop early and walk the rest of the way.  Stand up or walk around during all of your indoor phone calls.  Get up, stretch, and walk around every 30 minutes throughout the day.  Enjoy exercise with a friend. Support to continue exercising will help you keep a regular routine of activity. What guidelines can I follow while exercising?  Before you start a new exercise program, talk with your health care provider.  Do not exercise so much that you hurt yourself, feel dizzy, or get very short of breath.  Wear comfortable clothes and wear shoes with good support.  Drink plenty of water while you  exercise to prevent dehydration or heat stroke.  Work out until your breathing and your heartbeat get faster. Where to find more information  [place] Department of Health and Human Services: ThisPath.fi  Centers for Disease Control and Prevention (CDC): FootballExhibition.com.br Summary  Exercising regularly is important. It will improve your overall fitness, flexibility, and endurance.  Regular exercise also will improve your overall health. It can help you control your weight, reduce stress, and improve your bone density.  Do not exercise so much that you hurt yourself, feel dizzy, or get very short of breath.  Before you start a new exercise program, talk with your health care provider. This information is not intended to replace advice  given to you by your health care provider. Make sure you discuss any questions you have with your health care provider. Document Revised: 11/28/2017 Document Reviewed: 11/06/2017 Elsevier Patient Education  2020 [employer].

## 2020-05-30 NOTE — Progress Notes (Signed)
Established Patient Office Visit  Subjective:  Patient ID: Henry Hernandez, male    DOB: [birth year]  Age: 54 y.o. MRN: [id number]  CC:  Chief Complaint  Patient presents with  . Hypertension    Follow up     HPI Henry Hernandez presents for follow-up of his blood pressure, dysthymia of with anxiety B12 deficiency.  Blood pressure well controlled with olmesartan/HCTZ low-dose Toprol.  Having somewhat of a paradoxical response to Prozac.  It causes some drowsiness.  It is helped some but he continues to feel a little sad and anxious on the lower dose.  Makes him drowsy.  He takes it at night.  Under a lot of stress at work, at home in dealing with his incompetent hand with dementia.  She has been placed in assisted living and he is clearing things up with her and hopes to deal with his other stressors.  Having no issue with the B12 monthly injections.  Past Medical History:  Diagnosis Date  . Allergy   . Chicken pox   . Hyperlipidemia   . Hypertension     Past Surgical History:  Procedure Laterality Date  . BACK SURGERY      Family History  Problem Relation Age of Onset  . Arthritis Mother   . COPD Mother   . Diabetes Mother   . Heart attack Mother   . Heart disease Mother   . Hyperlipidemia Mother   . Hypertension Mother   . Kidney disease Mother   . Stroke Mother   . Hypertension Father   . Hypertension Sister   . Hypertension Brother   . Cancer Maternal Grandmother   . Hypertension Maternal Grandmother     Social History   Socioeconomic History  . Marital status: Married    Spouse name: Not on file  . Number of children: Not on file  . Years of education: Not on file  . Highest education level: Not on file  Occupational History  . Not on file  Tobacco Use  . Smoking status: Never Smoker  . Smokeless tobacco: Never Used  Substance and Sexual Activity  . Alcohol use: Not Currently  . Drug use: Not Currently  . Sexual activity: Not on file  Other Topics  Concern  . Not on file  Social History Narrative  . Not on file   Social Determinants of Health   Financial Resource Strain:   . Difficulty of Paying Living Expenses:   Food Insecurity:   . Worried About Programme researcher, broadcasting/film/video in the Last Year:   . Barista in the Last Year:   Transportation Needs:   . Freight forwarder (Medical):   [name] Lack of Transportation (Non-Medical):   Physical Activity:   . Days of Exercise per Week:   . Minutes of Exercise per Session:   Stress:   . Feeling of Stress :   Social Connections:   . Frequency of Communication with Friends and Family:   . Frequency of Social Gatherings with Friends and Family:   . Attends Religious Services:   . Active Member of Clubs or Organizations:   . Attends Banker Meetings:   [name] Marital Status:   Intimate Partner Violence:   . Fear of Current or Ex-Partner:   . Emotionally Abused:   [name] Physically Abused:   . Sexually Abused:     Outpatient Medications Prior to Visit  Medication Sig Dispense Refill  . aspirin EC 81 MG  tablet Take 1 tablet by mouth daily.    [name] atorvastatin (LIPITOR) 20 MG tablet TAKE 1 TABLET BY MOUTH EVERY DAY 90 tablet 2  . cyanocobalamin (,VITAMIN B-12,) 1000 MCG/ML injection INJECT 1 ML (1,000 MCG TOTAL) INTO THE MUSCLE ONCE FOR 1 DOSE.    . meloxicam (MOBIC) 15 MG tablet TAKE 1 TABLET (15 MG TOTAL) BY MOUTH DAILY. FOR 10 DAYS AND THEN AS NEEDED WITH FOOD.    [name] olmesartan-hydrochlorothiazide (BENICAR HCT) 40-25 MG tablet Take 1 tablet by mouth daily. 90 tablet 1  . metoprolol succinate (TOPROL-XL) 25 MG 24 hr tablet TAKE 1 TABLET BY MOUTH EVERY DAY 90 tablet 2  . FLUoxetine (PROZAC) 10 MG capsule Take 1 capsule (10 mg total) by mouth daily. 30 capsule 1   No facility-administered medications prior to visit.    No Known Allergies  ROS Review of Systems  Constitutional: Negative.   HENT: Negative.   Respiratory: Negative.   Cardiovascular: Negative.     Gastrointestinal: Negative.   Neurological: Negative for light-headedness and numbness.  Hematological: Negative.   Psychiatric/Behavioral: Negative.        Depression screen [facility] 2/9 05/30/2020 05/30/2020 04/24/2020  Decreased Interest 1 1 1   Down, Depressed, Hopeless 1 1 1   PHQ - 2 Score 2 2 2   Altered sleeping 0 - 0  Tired, decreased energy 2 - 2  Change in appetite 0 - 2  Feeling bad or failure about yourself  2 - 0  Trouble concentrating 1 - 1  Moving slowly or fidgety/restless 1 - 3  Suicidal thoughts 0 - 1  PHQ-9 Score 8 - 11  Difficult doing work/chores Not difficult at all - Not difficult at all    Objective:    Physical Exam  Constitutional: He is oriented to person, place, and time. He appears well-developed and well-nourished. No distress.  HENT:  Head: Normocephalic and atraumatic.  Right Ear: External ear normal.  Left Ear: External ear normal.  Eyes: Conjunctivae are normal. Right eye exhibits no discharge. Left eye exhibits no discharge. No scleral icterus.  Neck: No JVD present. No tracheal deviation present.  Pulmonary/Chest: Effort normal. No stridor.  Neurological: He is alert and oriented to person, place, and time.  Skin: Skin is warm and dry. He is not diaphoretic.  Psychiatric: He has a normal mood and affect. His behavior is normal.    BP 136/84 (BP Location: Left Arm, Patient Position: Sitting, Cuff Size: Normal)   Pulse (!) 59   Temp 97.9 F (36.6 C) (Temporal)   Resp 18   Wt 189 lb (85.7 kg)   SpO2 98%   BMI 27.12 kg/m  Wt Readings from Last 3 Encounters:  05/30/20 189 lb (85.7 kg)  04/24/20 188 lb 6.4 oz (85.5 kg)  03/24/20 186 lb 6.4 oz (84.6 kg)     Health Maintenance Due  Topic Date Due  . COVID-19 Vaccine (1) Never done    There are no preventive care reminders to display for this patient.  Lab Results  Component Value Date   TSH 2.29 03/24/2020   Lab Results  Component Value Date   WBC 3.5 (L) 03/24/2020   HGB 13.0  03/24/2020   HCT 37.9 (L) 03/24/2020   MCV 87.0 03/24/2020   PLT 148.0 (L) 03/24/2020   Lab Results  Component Value Date   NA 140 03/24/2020   K 3.6 03/24/2020   CO2 29 03/24/2020   GLUCOSE 118 (H) 03/24/2020   BUN 23 03/24/2020  CREATININE 1.22 03/24/2020   BILITOT 0.7 03/24/2020   ALKPHOS 52 03/24/2020   AST 17 03/24/2020   ALT 29 03/24/2020   PROT 6.7 03/24/2020   ALBUMIN 4.4 03/24/2020   CALCIUM 9.3 03/24/2020   GFR 61.95 03/24/2020   Lab Results  Component Value Date   CHOL 148 03/24/2020   Lab Results  Component Value Date   HDL 43.80 03/24/2020   Lab Results  Component Value Date   LDLCALC 83 03/24/2020   Lab Results  Component Value Date   TRIG 105.0 03/24/2020   Lab Results  Component Value Date   CHOLHDL 3 03/24/2020   Lab Results  Component Value Date   HGBA1C 5.9 03/24/2020      Assessment & Plan:   Problem List Items Addressed This Visit      Cardiovascular and Mediastinum   Essential hypertension   Relevant Medications   metoprolol succinate (TOPROL-XL) 25 MG 24 hr tablet     Other   Anxiety   Relevant Medications   FLUoxetine (PROZAC) 10 MG capsule   Dysthymia - Primary   Relevant Medications   FLUoxetine (PROZAC) 10 MG capsule      Meds ordered this encounter  Medications  . FLUoxetine (PROZAC) 10 MG capsule    Sig: Take 2 capsules (20 mg total) by mouth daily.    Dispense:  60 capsule    Refill:  2  . metoprolol succinate (TOPROL-XL) 25 MG 24 hr tablet    Sig: Take 1 tablet (25 mg total) by mouth daily.    Dispense:  90 tablet    Refill:  2    Follow-up: Return in about 3 months (around 08/30/2020).  Given information on exercising for health and managing anxiety.  Suggested that he try meditation.  We discussed changing Prozac then decided to go for an increase to 20 mg daily.  If he does not tolerate this he will continue the 10 mg dose.  Continue monthly B12 injections.  Mliss Sax, MD

## 2020-06-19 ENCOUNTER — Other Ambulatory Visit: Payer: Self-pay | Admitting: Family Medicine

## 2020-06-19 DIAGNOSIS — F341 Dysthymic disorder: Secondary | ICD-10-CM

## 2020-06-19 DIAGNOSIS — F419 Anxiety disorder, unspecified: Secondary | ICD-10-CM

## 2020-06-21 ENCOUNTER — Other Ambulatory Visit: Payer: Self-pay | Admitting: Family Medicine

## 2020-06-21 DIAGNOSIS — E7800 Pure hypercholesterolemia, unspecified: Secondary | ICD-10-CM

## 2020-07-03 ENCOUNTER — Other Ambulatory Visit: Payer: Self-pay | Admitting: Family Medicine

## 2020-07-03 DIAGNOSIS — M7021 Olecranon bursitis, right elbow: Secondary | ICD-10-CM

## 2020-07-29 ENCOUNTER — Other Ambulatory Visit: Payer: Self-pay | Admitting: Family Medicine

## 2020-07-29 DIAGNOSIS — M7021 Olecranon bursitis, right elbow: Secondary | ICD-10-CM

## 2020-08-10 ENCOUNTER — Emergency Department (INDEPENDENT_AMBULATORY_CARE_PROVIDER_SITE_OTHER)
Admission: EM | Admit: 2020-08-10 | Discharge: 2020-08-10 | Disposition: A | Discharge status: Home / Self Care | Payer: No Typology Code available for payment source | Source: Home / Self Care

## 2020-08-10 ENCOUNTER — Other Ambulatory Visit: Payer: Self-pay

## 2020-08-10 ENCOUNTER — Encounter: Payer: Self-pay | Admitting: Emergency Medicine

## 2020-08-10 ENCOUNTER — Emergency Department (INDEPENDENT_AMBULATORY_CARE_PROVIDER_SITE_OTHER): Payer: No Typology Code available for payment source

## 2020-08-10 DIAGNOSIS — S92212A Displaced fracture of cuboid bone of left foot, initial encounter for closed fracture: Secondary | ICD-10-CM

## 2020-08-10 DIAGNOSIS — M79672 Pain in left foot: Secondary | ICD-10-CM | POA: Diagnosis not present

## 2020-08-10 NOTE — ED Provider Notes (Signed)
[facility]    CSN: [id number] Arrival date & time: 08/10/20  1139      History   Chief Complaint Chief Complaint  Patient presents with  . Foot Pain  . Ankle Pain    HPI Henry Hernandez is a 54 y.o. male.   HPI Henry Hernandez is a 54 y.o. male presenting to UC with c/o Left foot pain after rolling it about 2 months ago (triage states 4 weeks but pt stated its been 60-70 days since initial injury).  He treated at home with RICE, pain improved for the last 2 weeks.  [place] he [place] the zoo, believes he walked about 3-4 miles while there. He did not re-injure it but today when he woke up he could not bear weight on his foot.  Pain is sharp and aching at the lateral aspect of mid foot.  He took Meloxicam at 10AM with mild relief.  No hx of fracture in same foot before.    Past Medical History:  Diagnosis Date  . Allergy   . Chicken pox   . Hyperlipidemia   . Hypertension     Patient Active Problem List   Diagnosis Date Noted  . Anxiety 04/24/2020  . Dysthymia 04/24/2020  . Obesity (BMI 30-39.9) 05/28/2019  . Elevated glucose 05/28/2019  . B12 deficiency 05/28/2019  . Healthcare maintenance 05/25/2019  . Anemia 05/25/2019  . Rash 05/25/2019  . Olecranon bursitis of right elbow 11/30/2018  . Essential hypertension 03/13/2018  . Elevated cholesterol 03/13/2018  . Weight gain 03/13/2018  . Fatigue 03/13/2018  . Vitamin D deficiency 03/13/2018    Past Surgical History:  Procedure Laterality Date  . BACK SURGERY         Home Medications    Prior to Admission medications   Medication Sig Start Date End Date Taking? Authorizing Provider  atorvastatin (LIPITOR) 20 MG tablet TAKE 1 TABLET BY MOUTH EVERY DAY 06/21/20  Yes Mliss Sax, MD  FLUoxetine (PROZAC) 10 MG capsule TAKE 1 CAPSULE BY MOUTH EVERY DAY 06/19/20  Yes Mliss Sax, MD  meloxicam (MOBIC) 15 MG tablet TAKE 1 TABLET (15 MG TOTAL) BY MOUTH DAILY. FOR 10 DAYS AND THEN AS  NEEDED WITH FOOD. 07/30/20  Yes Mliss Sax, MD  metoprolol succinate (TOPROL-XL) 25 MG 24 hr tablet Take 1 tablet (25 mg total) by mouth daily. 05/30/20  Yes Mliss Sax, MD  olmesartan-hydrochlorothiazide (BENICAR HCT) 40-25 MG tablet Take 1 tablet by mouth daily. 04/24/20  Yes Mliss Sax, MD  aspirin EC 81 MG tablet Take 1 tablet by mouth daily.    [provider]  cyanocobalamin (,VITAMIN B-12,) 1000 MCG/ML injection INJECT 1 ML (1,000 MCG TOTAL) INTO THE MUSCLE ONCE FOR 1 DOSE. 03/30/20   [provider]    Family History Family History  Problem Relation Age of Onset  . Arthritis Mother   . COPD Mother   . Diabetes Mother   . Heart attack Mother   . Heart disease Mother   . Hyperlipidemia Mother   . Hypertension Mother   . Kidney disease Mother   . Stroke Mother   . Hypertension Father   . Hypertension Sister   . Hypertension Brother   . Cancer Maternal Grandmother   . Hypertension Maternal Grandmother     Social History Social History   Tobacco Use  . Smoking status: Never Smoker  . Smokeless tobacco: Never Used  Vaping Use  . Vaping Use: Never  used  Substance Use Topics  . Alcohol use: Not Currently  . Drug use: Not Currently     Allergies   Patient has no known allergies.   Review of Systems Review of Systems  Musculoskeletal: Positive for arthralgias, gait problem, joint swelling and myalgias.  Skin: Positive for color change ( Left foot: initial bruising but none recently). Negative for wound.  Neurological: Negative for weakness and numbness.     Physical Exam Triage Vital Signs ED Triage Vitals  Enc Vitals Group     BP 08/10/20 1152 (!) 154/91     Pulse Rate 08/10/20 1152 72     Resp 08/10/20 1152 17     Temp 08/10/20 1152 99.5 F (37.5 C)     Temp Source 08/10/20 1152 Oral     SpO2 08/10/20 1152 98 %     Weight --      Height --      Head Circumference --      Peak Flow --      Pain Score  08/10/20 1154 4     Pain Loc --      Pain Edu? --      Excl. in GC? --    No data found.  Updated Vital Signs BP (!) 154/91 (BP Location: Right Arm)   Pulse 72   Temp 99.5 F (37.5 C) (Oral)   Resp 17   SpO2 98%   Visual Acuity Right Eye Distance:   Left Eye Distance:   Bilateral Distance:    Right Eye Near:   Left Eye Near:    Bilateral Near:     Physical Exam Vitals and nursing note reviewed.  Constitutional:      Appearance: He is well-developed.  HENT:     Head: Normocephalic and atraumatic.  Cardiovascular:     Rate and Rhythm: Normal rate and regular rhythm.     Pulses:          Dorsalis pedis pulses are 2+ on the left side.       Posterior tibial pulses are 2+ on the left side.  Pulmonary:     Effort: Pulmonary effort is normal.  Musculoskeletal:        General: Swelling and tenderness present. Normal range of motion.     Cervical back: Normal range of motion.       Feet:  Skin:    General: Skin is warm and dry.     Capillary Refill: Capillary refill takes less than 2 seconds.     Findings: No bruising or erythema.  Neurological:     Mental Status: He is alert and oriented to person, place, and time.     Sensory: No sensory deficit.  Psychiatric:        Behavior: Behavior normal.      UC Treatments / Results  Labs (all labs ordered are listed, but only abnormal results are displayed) Labs Reviewed - No data to display  EKG   Radiology DG Foot Complete Left  Result Date: 08/10/2020 CLINICAL DATA:  Pain and swelling EXAM: LEFT FOOT - COMPLETE 3+ VIEW COMPARISON:  None. FINDINGS: Minimally displaced inferior cuboid fracture. Otherwise normal foot alignment with approximation of the joints. There is no evidence of arthropathy or focal bone lesion. Mild soft tissue swelling. IMPRESSION: Minimally displaced inferior cuboid fracture. Mild soft tissue swelling. Electronically Signed   By: Stana Bunting M.D.   On: 08/10/2020 13:19     Procedures Procedures (including critical care time)  Medications  Ordered in UC Medications - No data to display  Initial Impression / Assessment and Plan / UC Course  I have reviewed the triage vital signs and the nursing notes.  Pertinent labs & imaging results that were available during my care of the patient were reviewed by me and considered in my medical decision making (see chart for details).     Reviewed imaging with pt Pt placed in boot, given crutches Encouraged non-weightbearing until f/u with Sports Medicine or Orthopedist AVS given  Final Clinical Impressions(s) / UC Diagnoses   Final diagnoses:  Closed displaced fracture of cuboid of left foot, initial encounter     Discharge Instructions      Please call to schedule a follow up appointment with Sports Medicine or an Orthopedist next week for further evaluation and treatment of your foot fracture.  Surgery may be recommended depending on their findings.  Additional imaging may be obtained to get a better picture of the fracture.  You can remove the boot to bath or apply a cool compress but otherwise, a boot should be warn to help protect your foot and keep your ankle still.   You may take 500mg  acetaminophen every 4-6 hours or in combination with ibuprofen 400-600mg  every 6-8 hours as needed for pain and inflammation.     ED Prescriptions    None     PDMP not reviewed this encounter.   , Lurene Shadow 08/11/20 1702

## 2020-08-10 NOTE — ED Triage Notes (Signed)
Rolled his left foot 4 weeks ago - treated at home w/ RICE Felt better after 2 weeks  [place] the [place] -left foot pain since yesterday - tingling and throbbing pain - increased pain when standing on top of foot Swelling in left ankle & foot was 4 weeks ago  Meloxicam at 1000 today COVID vaccine [facility]  April 2021

## 2020-08-10 NOTE — Discharge Instructions (Signed)
  Please call to schedule a follow up appointment with Sports Medicine or an Orthopedist next week for further evaluation and treatment of your foot fracture.  Surgery may be recommended depending on their findings.  Additional imaging may be obtained to get a better picture of the fracture.  You can remove the boot to bath or apply a cool compress but otherwise, a boot should be warn to help protect your foot and keep your ankle still.   You may take 500mg  acetaminophen every 4-6 hours or in combination with ibuprofen 400-600mg  every 6-8 hours as needed for pain and inflammation.

## 2020-08-15 ENCOUNTER — Encounter: Payer: Self-pay | Admitting: Sports Medicine

## 2020-08-15 ENCOUNTER — Ambulatory Visit (INDEPENDENT_AMBULATORY_CARE_PROVIDER_SITE_OTHER): Payer: No Typology Code available for payment source | Admitting: Sports Medicine

## 2020-08-15 ENCOUNTER — Other Ambulatory Visit: Payer: Self-pay

## 2020-08-15 DIAGNOSIS — M79672 Pain in left foot: Secondary | ICD-10-CM | POA: Diagnosis not present

## 2020-08-15 DIAGNOSIS — G8929 Other chronic pain: Secondary | ICD-10-CM

## 2020-08-15 MED ORDER — CALCIUM CARBONATE-VITAMIN D 600-400 MG-UNIT PO TABS: 1.0000 | ORAL_TABLET | Freq: Two times a day (BID) | ORAL | 11 refills | Status: DC

## 2020-08-15 NOTE — Assessment & Plan Note (Signed)
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 Afterwards, about a month ago he went to the zoo, spent a lot of time on his feet. Subsequent to this and the next day he had severe pain that he localizes at the shaft of the fifth metatarsal on the left. On exam he has no tenderness over his cuboid but he is significantly tender at the fifth metatarsal shaft at the junction of the second and third zones. I think he has a fifth metatarsal stress fracture, we will add calcium and vitamin D, continue meloxicam, I would transition him from a boot into a postop shoe. Return to see me in a month, MRI if no better.

## 2020-08-15 NOTE — Progress Notes (Signed)
    Procedures performed today:    None.  Independent interpretation of notes and tests performed by another provider:   X-rays personally reviewed, there does appear to be a well corticated ossicle adjacent to the cuboid, I think this is representative of an old fracture and is no longer clinically relevant.  Fifth metatarsal itself looks normal.  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 Afterwards, about a month ago he went to the zoo, spent a lot of time on his feet. Subsequent to this and the next day he had severe pain that he localizes at the shaft of the fifth metatarsal on the left. On exam he has no tenderness over his cuboid but he is significantly tender at the fifth metatarsal shaft at the junction of the second and third zones. I think he has a fifth metatarsal stress fracture, we will add calcium and vitamin D, continue meloxicam, I would transition him from a boot into a postop shoe. Return to see me in a month, MRI if no better.    ___________________________________________ Ihor Austin. Benjamin Stain, M.D., ABFM., CAQSM. Primary Care and Sports Medicine [facility]  Adjunct Instructor of Family Medicine  [employer]

## 2020-08-19 ENCOUNTER — Encounter: Payer: Self-pay | Admitting: Emergency Medicine

## 2020-08-19 ENCOUNTER — Other Ambulatory Visit: Payer: Self-pay

## 2020-08-19 ENCOUNTER — Emergency Department (INDEPENDENT_AMBULATORY_CARE_PROVIDER_SITE_OTHER)
Admission: EM | Admit: 2020-08-19 | Discharge: 2020-08-19 | Disposition: A | Discharge status: Home / Self Care | Payer: No Typology Code available for payment source | Source: Home / Self Care

## 2020-08-19 DIAGNOSIS — M109 Gout, unspecified: Secondary | ICD-10-CM

## 2020-08-19 DIAGNOSIS — R030 Elevated blood-pressure reading, without diagnosis of hypertension: Secondary | ICD-10-CM

## 2020-08-19 MED ORDER — HYDROCODONE-ACETAMINOPHEN 5-325 MG PO TABS: 1.0000 | ORAL_TABLET | Freq: Four times a day (QID) | ORAL | 0 refills | Status: DC | PRN

## 2020-08-19 NOTE — ED Triage Notes (Signed)
Patient has had an injury in left foot x 60 days ago, gotten better, now he believes he has gout.  Big toe is red and inflammed.  Taking Meloxicam.

## 2020-08-19 NOTE — ED Provider Notes (Signed)
[facility]    CSN: [id number] Arrival date & time: 08/19/20  1218      History   Chief Complaint Chief Complaint  Patient presents with  . Foot Pain    HPI Henry Hernandez is a 54 y.o. male.   HPI  Henry Hernandez is a 54 y.o. male presenting to UC with c/o Left foot pain for the last 2 months after an initial injury rolling his foot. He was seen by Sports Medicine last week, tx for a stress fracture and changed from a walking boot to a post-op shoe. Pt has since developed redness, swelling and pain in his Left great toe. He is concerned he has gout.  Pain is aching, 4/10 at this time.    Past Medical History:  Diagnosis Date  . Allergy   . Chicken pox   . Hyperlipidemia   . Hypertension     Patient Active Problem List   Diagnosis Date Noted  . Pain, foot, left, chronic 08/15/2020  . Anxiety 04/24/2020  . Dysthymia 04/24/2020  . Obesity (BMI 30-39.9) 05/28/2019  . Elevated glucose 05/28/2019  . B12 deficiency 05/28/2019  . Healthcare maintenance 05/25/2019  . Anemia 05/25/2019  . Rash 05/25/2019  . Olecranon bursitis of right elbow 11/30/2018  . Essential hypertension 03/13/2018  . Elevated cholesterol 03/13/2018  . Weight gain 03/13/2018  . Fatigue 03/13/2018  . Vitamin D deficiency 03/13/2018    Past Surgical History:  Procedure Laterality Date  . BACK SURGERY         Home Medications    Prior to Admission medications   Medication Sig Start Date End Date Taking? Authorizing Provider  aspirin EC 81 MG tablet Take 1 tablet by mouth daily.    [provider]  atorvastatin (LIPITOR) 20 MG tablet TAKE 1 TABLET BY MOUTH EVERY DAY 06/21/20   Mliss Sax, MD  Calcium Carbonate-Vitamin D 600-400 MG-UNIT tablet Take 1 tablet by mouth 2 (two) times daily. 08/15/20   Monica Becton, MD  cyanocobalamin (,VITAMIN B-12,) 1000 MCG/ML injection INJECT 1 ML (1,000 MCG TOTAL) INTO THE MUSCLE ONCE FOR 1 DOSE. 03/30/20   [provider]  FLUoxetine (PROZAC) 10 MG capsule TAKE 1 CAPSULE BY MOUTH EVERY DAY 06/19/20   Mliss Sax, MD  HYDROcodone-acetaminophen (NORCO/VICODIN) 5-325 MG tablet Take 1 tablet by mouth every 6 (six) hours as needed. 08/19/20   Lurene Shadow, PA-C  meloxicam (MOBIC) 15 MG tablet TAKE 1 TABLET (15 MG TOTAL) BY MOUTH DAILY. FOR 10 DAYS AND THEN AS NEEDED WITH FOOD. 07/30/20   Mliss Sax, MD  metoprolol succinate (TOPROL-XL) 25 MG 24 hr tablet Take 1 tablet (25 mg total) by mouth daily. 05/30/20   Mliss Sax, MD  olmesartan-hydrochlorothiazide (BENICAR HCT) 40-25 MG tablet Take 1 tablet by mouth daily. 04/24/20   Mliss Sax, MD    Family History Family History  Problem Relation Age of Onset  . Arthritis Mother   . COPD Mother   . Diabetes Mother   . Heart attack Mother   . Heart disease Mother   . Hyperlipidemia Mother   . Hypertension Mother   . Kidney disease Mother   . Stroke Mother   . Hypertension Father   . Hypertension Sister   . Hypertension Brother   . Cancer Maternal Grandmother   . Hypertension Maternal Grandmother     Social History Social History   Tobacco Use  . Smoking status: Never Smoker  .  Smokeless tobacco: Never Used  Vaping Use  . Vaping Use: Never used  Substance Use Topics  . Alcohol use: Not Currently  . Drug use: Not Currently     Allergies   Patient has no known allergies.   Review of Systems Review of Systems  Musculoskeletal: Positive for arthralgias and joint swelling. Negative for myalgias.  Skin: Positive for color change. Negative for wound.     Physical Exam Triage Vital Signs ED Triage Vitals  Enc Vitals Group     BP 08/19/20 1452 (!) 162/105     Pulse Rate 08/19/20 1452 65     Resp --      Temp --      Temp src --      SpO2 08/19/20 1452 99 %     Weight --      Height --      Head Circumference --      Peak Flow --      Pain Score 08/19/20 1454 4     Pain Loc --       Pain Edu? --      Excl. in GC? --    No data found.  Updated Vital Signs BP (!) 161/91 (BP Location: Right Arm)   Pulse 65   SpO2 99%   Visual Acuity Right Eye Distance:   Left Eye Distance:   Bilateral Distance:    Right Eye Near:   Left Eye Near:    Bilateral Near:     Physical Exam Vitals and nursing note reviewed.  Constitutional:      Appearance: Normal appearance. He is well-developed.  HENT:     Head: Normocephalic and atraumatic.  Cardiovascular:     Rate and Rhythm: Normal rate.  Pulmonary:     Effort: Pulmonary effort is normal.  Musculoskeletal:        General: Swelling and tenderness present. Normal range of motion.     Cervical back: Normal range of motion.     Comments: Left great toe: mild edema, tenderness.   Skin:    General: Skin is warm and dry.     Capillary Refill: Capillary refill takes less than 2 seconds.  Neurological:     Mental Status: He is alert and oriented to person, place, and time.     Sensory: No sensory deficit.  Psychiatric:        Behavior: Behavior normal.      UC Treatments / Results  Labs (all labs ordered are listed, but only abnormal results are displayed) Labs Reviewed - No data to display  EKG   Radiology No results found.  Procedures Procedures (including critical care time)  Medications Ordered in UC Medications - No data to display  Initial Impression / Assessment and Plan / UC Course  I have reviewed the triage vital signs and the nursing notes.  Pertinent labs & imaging results that were available during my care of the patient were reviewed by me and considered in my medical decision making (see chart for details).     Hx and exam c/w gout Will tx pain Encouraged f/u with Sports Medicine and PCP AVS given   Final Clinical Impressions(s) / UC Diagnoses   Final diagnoses:  Elevated blood pressure reading  Gouty arthritis of left great toe     Discharge Instructions      Your blood  pressure was elevated today. Be sure to take your medication as prescribed and monitor your blood pressure at home to make  sure you do not develop long term health conditions such as kidney injury or increase risk of heart attack or stroke.   Call Monday to schedule a follow up appointment with your primary care provider and/or sports medicine later this week for recheck of symptoms.  Norco/Vicodin (hydrocodone-acetaminophen) is a narcotic pain medication, do not combine these medications with others containing tylenol. While taking, do not drink alcohol, drive, or perform any other activities that requires focus while taking these medications.      ED Prescriptions    Medication Sig Dispense Auth. Provider   HYDROcodone-acetaminophen (NORCO/VICODIN) 5-325 MG tablet Take 1 tablet by mouth every 6 (six) hours as needed. 10 tablet Lurene Shadow, PA-C     I have reviewed the PDMP during this encounter.   Lurene Shadow, PA-C 08/21/20 1101

## 2020-08-19 NOTE — Discharge Instructions (Signed)
  Your blood pressure was elevated today. Be sure to take your medication as prescribed and monitor your blood pressure at home to make sure you do not develop long term health conditions such as kidney injury or increase risk of heart attack or stroke.   Call Monday to schedule a follow up appointment with your primary care provider and/or sports medicine later this week for recheck of symptoms.  Norco/Vicodin (hydrocodone-acetaminophen) is a narcotic pain medication, do not combine these medications with others containing tylenol. While taking, do not drink alcohol, drive, or perform any other activities that requires focus while taking these medications.

## 2020-09-03 ENCOUNTER — Other Ambulatory Visit: Payer: Self-pay | Admitting: Family Medicine

## 2020-09-03 DIAGNOSIS — F419 Anxiety disorder, unspecified: Secondary | ICD-10-CM

## 2020-09-03 DIAGNOSIS — F341 Dysthymic disorder: Secondary | ICD-10-CM

## 2020-09-08 ENCOUNTER — Other Ambulatory Visit: Payer: Self-pay | Admitting: Family Medicine

## 2020-09-08 DIAGNOSIS — M7021 Olecranon bursitis, right elbow: Secondary | ICD-10-CM

## 2020-09-18 ENCOUNTER — Telehealth: Payer: Self-pay

## 2020-09-18 NOTE — Telephone Encounter (Signed)
Patient was given a new postop shoe, Henry Hernandez aware about the exchange.

## 2020-09-18 NOTE — Telephone Encounter (Signed)
Pt left a vm msg stating that the post-op boot broke. Pt has been using the boot consistently as informed by provider. Requesting a new Left post-op boot. Pt has an upcoming appt on 09/25/20 but would like to pick up the new boot before his next visit. Please advise, thanks.

## 2020-09-19 ENCOUNTER — Other Ambulatory Visit: Payer: Self-pay

## 2020-09-20 ENCOUNTER — Encounter: Payer: Self-pay | Admitting: Family Medicine

## 2020-09-20 ENCOUNTER — Ambulatory Visit (INDEPENDENT_AMBULATORY_CARE_PROVIDER_SITE_OTHER): Payer: No Typology Code available for payment source | Admitting: Family Medicine

## 2020-09-20 VITALS — BP 132/80 | HR 60 | Temp 97.4°F | Ht 70.0 in | Wt 198.8 lb

## 2020-09-20 DIAGNOSIS — E538 Deficiency of other specified B group vitamins: Secondary | ICD-10-CM | POA: Diagnosis not present

## 2020-09-20 DIAGNOSIS — Z8739 Personal history of other diseases of the musculoskeletal system and connective tissue: Secondary | ICD-10-CM | POA: Diagnosis not present

## 2020-09-20 DIAGNOSIS — F419 Anxiety disorder, unspecified: Secondary | ICD-10-CM

## 2020-09-20 DIAGNOSIS — E7800 Pure hypercholesterolemia, unspecified: Secondary | ICD-10-CM

## 2020-09-20 DIAGNOSIS — F341 Dysthymic disorder: Secondary | ICD-10-CM

## 2020-09-20 DIAGNOSIS — I10 Essential (primary) hypertension: Secondary | ICD-10-CM | POA: Diagnosis not present

## 2020-09-20 LAB — URINALYSIS, ROUTINE W REFLEX MICROSCOPIC
Bilirubin Urine: NEGATIVE
Hgb urine dipstick: NEGATIVE
Ketones, ur: NEGATIVE
Leukocytes,Ua: NEGATIVE
Nitrite: NEGATIVE
RBC / HPF: NONE SEEN (ref 0–?)
Specific Gravity, Urine: 1.025 (ref 1.000–1.030)
Total Protein, Urine: NEGATIVE
Urine Glucose: NEGATIVE
Urobilinogen, UA: 0.2 (ref 0.0–1.0)
pH: 6 (ref 5.0–8.0)

## 2020-09-20 LAB — CBC
HCT: 37.9 % — ABNORMAL LOW (ref 39.0–52.0)
Hemoglobin: 13.1 g/dL (ref 13.0–17.0)
MCHC: 34.5 g/dL (ref 30.0–36.0)
MCV: 87.7 fl (ref 78.0–100.0)
Platelets: 154 10*3/uL (ref 150.0–400.0)
RBC: 4.33 Mil/uL (ref 4.22–5.81)
RDW: 13.1 % (ref 11.5–15.5)
WBC: 4 10*3/uL (ref 4.0–10.5)

## 2020-09-20 LAB — COMPREHENSIVE METABOLIC PANEL
ALT: 21 U/L (ref 0–53)
AST: 14 U/L (ref 0–37)
Albumin: 4.5 g/dL (ref 3.5–5.2)
Alkaline Phosphatase: 68 U/L (ref 39–117)
BUN: 20 mg/dL (ref 6–23)
CO2: 33 mEq/L — ABNORMAL HIGH (ref 19–32)
Calcium: 9.8 mg/dL (ref 8.4–10.5)
Chloride: 99 mEq/L (ref 96–112)
Creatinine, Ser: 1.18 mg/dL (ref 0.40–1.50)
GFR: 64.27 mL/min (ref >60.00)
Glucose, Bld: 104 mg/dL — ABNORMAL HIGH (ref 70–99)
Potassium: 3.7 mEq/L (ref 3.5–5.1)
Sodium: 141 mEq/L (ref 135–145)
Total Bilirubin: 0.8 mg/dL (ref 0.2–1.2)
Total Protein: 7.1 g/dL (ref 6.0–8.3)

## 2020-09-20 LAB — LIPID PANEL
Cholesterol: 167 mg/dL (ref 0–200)
HDL: 44.2 mg/dL (ref >39.00)
LDL Cholesterol: 83 mg/dL (ref 0–99)
NonHDL: 123.04
Total CHOL/HDL Ratio: 4
Triglycerides: 198 mg/dL — ABNORMAL HIGH (ref 0.0–149.0)
VLDL: 39.6 mg/dL (ref 0.0–40.0)

## 2020-09-20 LAB — VITAMIN B12: Vitamin B-12: 413 pg/mL (ref 211–911)

## 2020-09-20 LAB — URIC ACID: Uric Acid, Serum: 8.1 mg/dL — ABNORMAL HIGH (ref 4.0–7.8)

## 2020-09-20 NOTE — Progress Notes (Addendum)
Established Patient Office Visit  Subjective:  Patient ID: Henry Hernandez, male    DOB: Aug 29, 1966  Age: 54 y.o. MRN: [id number]  CC:  Chief Complaint  Patient presents with  . Follow-up    3 month follow up on BP, no concerns. Fasting for labs.    HPI Henry Hernandez presents for follow-up of hypertension, elevated cholesterol dysthymia question of gout.  Blood pressure has been well controlled with the metoprolol and Benicar with HCTZ.  He sustained a significant foot and ankle injury that involved fractures of his fibula and metatarsals.  As this was healing he developed a gouty attack in the ipsilateral great toe.  We had discontinued allopurinol in the distant past approximately 7 to 8 years ago secondary to question for its need.  He has not had a gouty attack after that time until recently.  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  Not sure the Prozac is helping much.  Seems to cause insomnia as well as tiredness at other times.  He is still stressed with family issues.  Father recently died.  Multiple family members have passed this year.  Past Medical History:  Diagnosis Date  . Allergy   . Chicken pox   . Hyperlipidemia   . Hypertension     Past Surgical History:  Procedure Laterality Date  . BACK SURGERY      Family History  Problem Relation Age of Onset  . Arthritis Mother   . COPD Mother   . Diabetes Mother   . Heart attack Mother   . Heart disease Mother   . Hyperlipidemia Mother   . Hypertension Mother   . Kidney disease Mother   . Stroke Mother   . Hypertension Father   . Hypertension Sister   . Hypertension Brother   . Cancer Maternal Grandmother   . Hypertension Maternal Grandmother     Social History   Socioeconomic History  . Marital status: Married    Spouse name: Not on file  . Number of children: Not on file  . Years of education: Not  on file  . Highest education level: Not on file  Occupational History  . Not on file  Tobacco Use  . Smoking status: Never Smoker  . Smokeless tobacco: Never Used  Vaping Use  . Vaping Use: Never used  Substance and Sexual Activity  . Alcohol use: Not Currently  . Drug use: Not Currently  . Sexual activity: Not on file  Other Topics Concern  . Not on file  Social History Narrative  . Not on file   Social Determinants of Health   Financial Resource Strain:   . Difficulty of Paying Living Expenses: Not on file  Food Insecurity:   . Worried About Programme researcher, broadcasting/film/video in the Last Year: Not on file  . Ran Out of Food in the Last Year: Not on file  Transportation Needs:   . Lack of Transportation (Medical): Not on file  . Lack of Transportation (Non-Medical): Not on file  Physical Activity:   . Days of Exercise per Week: Not on file  . Minutes of Exercise per Session: Not on file  Stress:   . Feeling of Stress : Not on file  Social Connections:   . Frequency of Communication with Friends and Family: Not on file  . Frequency of Social Gatherings with Friends  and Family: Not on file  . Attends Religious Services: Not on file  . Active Member of Clubs or Organizations: Not on file  . Attends Banker Meetings: Not on file  . Marital Status: Not on file  Intimate Partner Violence:   . Fear of Current or Ex-Partner: Not on file  . Emotionally Abused: Not on file  . Physically Abused: Not on file  . Sexually Abused: Not on file    Outpatient Medications Prior to Visit  Medication Sig Dispense Refill  . aspirin EC 81 MG tablet Take 1 tablet by mouth daily.    [name] atorvastatin (LIPITOR) 20 MG tablet TAKE 1 TABLET BY MOUTH EVERY DAY 90 tablet 2  . Calcium Carb-Cholecalciferol 600-400 MG-UNIT TABS Take 1 tablet by mouth 2 (two) times daily.    . cyanocobalamin (,VITAMIN B-12,) 1000 MCG/ML injection INJECT 1 ML (1,000 MCG TOTAL) INTO THE MUSCLE ONCE FOR 1 DOSE.    [name]  FLUoxetine (PROZAC) 10 MG capsule TAKE 2 CAPSULES BY MOUTH DAILY 180 capsule 1  . meloxicam (MOBIC) 15 MG tablet TAKE 1 TABLET (15 MG TOTAL) BY MOUTH DAILY. FOR 10 DAYS AND THEN AS NEEDED WITH FOOD. 30 tablet 0  . metoprolol succinate (TOPROL-XL) 25 MG 24 hr tablet Take 1 tablet (25 mg total) by mouth daily. 90 tablet 2  . olmesartan-hydrochlorothiazide (BENICAR HCT) 40-25 MG tablet Take 1 tablet by mouth daily. 90 tablet 1  . Calcium Carbonate-Vitamin D 600-400 MG-UNIT tablet Take 1 tablet by mouth 2 (two) times daily. (Patient not taking: Reported on 09/20/2020) 60 tablet 11  . HYDROcodone-acetaminophen (NORCO/VICODIN) 5-325 MG tablet Take 1 tablet by mouth every 6 (six) hours as needed. (Patient not taking: Reported on 09/20/2020) 10 tablet 0   No facility-administered medications prior to visit.    No Known Allergies  ROS Review of Systems  Constitutional: Negative.   HENT: Negative.   Respiratory: Negative.   Cardiovascular: Negative.   Gastrointestinal: Negative.   Endocrine: Negative for polyphagia and polyuria.  Genitourinary: Negative.   Musculoskeletal: Positive for arthralgias and gait problem.  Allergic/Immunologic: Negative for immunocompromised state.  Neurological: Negative for speech difficulty and weakness.  Hematological: Does not bruise/bleed easily.  Psychiatric/Behavioral: Positive for dysphoric mood. The patient is nervous/anxious.       Objective:    Physical Exam Vitals and nursing note reviewed.  Constitutional:      General: He is not in acute distress.    Appearance: Normal appearance. He is not ill-appearing or toxic-appearing.  HENT:     Head: Normocephalic and atraumatic.     Right Ear: External ear normal.     Left Ear: External ear normal.     Mouth/Throat:     Pharynx: Oropharynx is clear. No oropharyngeal exudate or posterior oropharyngeal erythema.  Eyes:     General:        Right eye: No discharge.        Left eye: No discharge.      Conjunctiva/sclera: Conjunctivae normal.     Pupils: Pupils are equal, round, and reactive to light.  Neck:     Vascular: No carotid bruit.  Cardiovascular:     Rate and Rhythm: Normal rate and regular rhythm.  Pulmonary:     Effort: Pulmonary effort is normal.     Breath sounds: Normal breath sounds.  Musculoskeletal:     Cervical back: Normal range of motion and neck supple. No rigidity.     Left foot: Swelling and tenderness present.  Lymphadenopathy:     Cervical: No cervical adenopathy.  Skin:    General: Skin is warm and dry.  Neurological:     Mental Status: He is alert and oriented to person, place, and time.  Psychiatric:        Mood and Affect: Mood normal.        Behavior: Behavior normal.     BP 132/80   Pulse 60   Temp (!) 97.4 F (36.3 C) (Tympanic)   Ht 5\' 10"  (1.778 m)   Wt 198 lb 12.8 oz (90.2 kg)   SpO2 97%   BMI 28.52 kg/m  Wt Readings from Last 3 Encounters:  09/20/20 198 lb 12.8 oz (90.2 kg)  05/30/20 189 lb (85.7 kg)  04/24/20 188 lb 6.4 oz (85.5 kg)     Health Maintenance Due  Topic Date Due  . Hepatitis C Screening  Never done  . INFLUENZA VACCINE  07/30/2020    There are no preventive care reminders to display for this patient.  Lab Results  Component Value Date   TSH 2.29 03/24/2020   Lab Results  Component Value Date   WBC 4.0 09/20/2020   HGB 13.1 09/20/2020   HCT 37.9 (L) 09/20/2020   MCV 87.7 09/20/2020   PLT 154.0 09/20/2020   Lab Results  Component Value Date   NA 141 09/20/2020   K 3.7 09/20/2020   CO2 33 (H) 09/20/2020   GLUCOSE 104 (H) 09/20/2020   BUN 20 09/20/2020   CREATININE 1.18 09/20/2020   BILITOT 0.8 09/20/2020   ALKPHOS 68 09/20/2020   AST 14 09/20/2020   ALT 21 09/20/2020   PROT 7.1 09/20/2020   ALBUMIN 4.5 09/20/2020   CALCIUM 9.8 09/20/2020   GFR 64.27 09/20/2020   Lab Results  Component Value Date   CHOL 167 09/20/2020   Lab Results  Component Value Date   HDL 44.20 09/20/2020   Lab  Results  Component Value Date   LDLCALC 83 09/20/2020   Lab Results  Component Value Date   TRIG 198.0 (H) 09/20/2020   Lab Results  Component Value Date   CHOLHDL 4 09/20/2020   Lab Results  Component Value Date   HGBA1C 5.9 03/24/2020      Assessment & Plan:   Problem List Items Addressed This Visit      Cardiovascular and Mediastinum   Essential hypertension   Relevant Orders   CBC (Completed)   Comprehensive metabolic panel (Completed)   Urinalysis, Routine w reflex microscopic (Completed)     Other   Elevated cholesterol   Relevant Orders   Lipid panel (Completed)   Comprehensive metabolic panel (Completed)   B12 deficiency   Relevant Orders   Vitamin B12 (Completed)   Anxiety   Dysthymia - Primary   History of gout   Relevant Medications   allopurinol (ZYLOPRIM) 300 MG tablet   colchicine 0.6 MG tablet   Other Relevant Orders   Uric acid (Completed)      Meds ordered this encounter  Medications  . allopurinol (ZYLOPRIM) 300 MG tablet    Sig: Take 1 tablet (300 mg total) by mouth daily.    Dispense:  90 tablet    Refill:  6  . colchicine 0.6 MG tablet    Sig: Take 1 tablet (0.6 mg total) by mouth daily.    Dispense:  90 tablet    Refill:  0    Follow-up: Return in about 4 months (around 01/20/2021).  Continue current medicines for now.  Agrees to continue Prozac.  Suggested that we make a change and he wants to continue it for now.  May consider a mood stabilizer.  Mliss Sax, MD

## 2020-09-21 MED ORDER — COLCHICINE 0.6 MG PO TABS: 0.6000 mg | ORAL_TABLET | Freq: Every day | ORAL | 0 refills | Status: DC

## 2020-09-21 MED ORDER — ALLOPURINOL 300 MG PO TABS: 300.0000 mg | ORAL_TABLET | Freq: Every day | ORAL | 6 refills | Status: DC

## 2020-09-21 NOTE — Addendum Note (Signed)
Addended by: Andrez Grime on: 09/21/2020 07:24 AM   Modules accepted: Orders

## 2020-09-25 ENCOUNTER — Ambulatory Visit (INDEPENDENT_AMBULATORY_CARE_PROVIDER_SITE_OTHER): Payer: No Typology Code available for payment source

## 2020-09-25 ENCOUNTER — Ambulatory Visit (INDEPENDENT_AMBULATORY_CARE_PROVIDER_SITE_OTHER): Payer: No Typology Code available for payment source | Admitting: Sports Medicine

## 2020-09-25 ENCOUNTER — Other Ambulatory Visit: Payer: Self-pay | Admitting: Family Medicine

## 2020-09-25 ENCOUNTER — Other Ambulatory Visit: Payer: Self-pay

## 2020-09-25 DIAGNOSIS — M79672 Pain in left foot: Secondary | ICD-10-CM | POA: Diagnosis not present

## 2020-09-25 DIAGNOSIS — M19072 Primary osteoarthritis, left ankle and foot: Secondary | ICD-10-CM

## 2020-09-25 DIAGNOSIS — I10 Essential (primary) hypertension: Secondary | ICD-10-CM

## 2020-09-25 DIAGNOSIS — G8929 Other chronic pain: Secondary | ICD-10-CM

## 2020-09-25 NOTE — Assessment & Plan Note (Signed)
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 Afterwards his pain completely resolved, [place] the zoo, spent a lot of time on his feet, developed severe pain at the shaft of the fifth metatarsal. Because his pain was distal to the subtalar joint we transitioned him to a postop shoe, unfortunately he has continued to have pain, swelling, he showed me a picture of his left foot after a day at work, and it was profoundly swollen. He does have tenderness over his cuboid today. Due to persistent symptoms greater than 6 weeks we are now going to proceed with an MRI of his foot, he will transition into his full boot rather than a postop shoe for now as he is having some Achilles pain as well. He will also be out of work for a full 3 weeks, I do anticipate FMLA paperwork that I am happy to fill out without an appointment for continuous leave for 3 weeks. Return to see me in 3 weeks.

## 2020-09-25 NOTE — Progress Notes (Signed)
    Procedures performed today:    None.  Independent interpretation of notes and tests performed by another provider:   None.  Brief History, Exam, Impression, and Recommendations:    Henry Hernandez is a pleasant 54yo male who presents today for follow up of left foot pain. He had severe pain at the shaft of his 5th metatarsal after walking a lot a little over a month ago. We suspected a potential fracture. Xrays demonstrated a possible old fracture at the cuboid bone without any other significant findings.  He has had significant swelling of the foot intermittently since the injury. 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 He wore a boot for a little while but transitioned a post op shoe. The boot helped to relieve some of his pain. The celebrex and gabapentin help some. We are going to get updated imaging with an MRI today. We are also going to have him start wearing the boot again and take 3 weeks off of work. He will follow up in 3 weeks for reevaluation.  Aurelio Jew, MS3   ___________________________________________ Henry Hernandez. Benjamin Stain, M.D., ABFM., CAQSM. Primary Care and Sports Medicine [facility]  Adjunct Instructor of Family Medicine  [employer]

## 2020-10-16 ENCOUNTER — Ambulatory Visit (INDEPENDENT_AMBULATORY_CARE_PROVIDER_SITE_OTHER): Payer: No Typology Code available for payment source | Admitting: Sports Medicine

## 2020-10-16 ENCOUNTER — Other Ambulatory Visit: Payer: Self-pay

## 2020-10-16 DIAGNOSIS — M79672 Pain in left foot: Secondary | ICD-10-CM

## 2020-10-16 DIAGNOSIS — G8929 Other chronic pain: Secondary | ICD-10-CM | POA: Diagnosis not present

## 2020-10-16 NOTE — Assessment & Plan Note (Signed)
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 Initially we started with a postop shoe which did not sufficiently provide improvement in his symptoms, I transitioned him into a boot about 3 weeks ago and he returns today almost completely symptom-free. We will do another 3 weeks in the boot to take him to full 6 weeks of immobilization. I think he can go ahead and return to work, he does not need to see me unless he continues to have discomfort at the 3-week point. He does have a significant leg length discrepancy with the boot causing some back pain so I added some heel lifts in the contralateral side today.

## 2020-10-16 NOTE — Progress Notes (Signed)
    Procedures performed today:    None.  Independent interpretation of notes and tests performed by another provider:   None.  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 Initially we started with a postop shoe which did not sufficiently provide improvement in his symptoms, I transitioned him into a boot about 3 weeks ago and he returns today almost completely symptom-free. We will do another 3 weeks in the boot to take him to full 6 weeks of immobilization. I think he can go ahead and return to work, he does not need to see me unless he continues to have discomfort at the 3-week point. He does have a significant leg length discrepancy with the boot causing some back pain so I added some heel lifts in the contralateral side today.    ___________________________________________ Ihor Austin. Benjamin Stain, M.D., ABFM., CAQSM. Primary Care and Sports Medicine [facility]  Adjunct Instructor of Family Medicine  [employer]

## 2020-11-03 ENCOUNTER — Other Ambulatory Visit: Payer: Self-pay | Admitting: Family Medicine

## 2020-11-03 DIAGNOSIS — M7021 Olecranon bursitis, right elbow: Secondary | ICD-10-CM

## 2020-12-14 ENCOUNTER — Other Ambulatory Visit: Payer: Self-pay | Admitting: Family Medicine

## 2020-12-14 DIAGNOSIS — M7021 Olecranon bursitis, right elbow: Secondary | ICD-10-CM

## 2021-01-07 ENCOUNTER — Other Ambulatory Visit: Payer: Self-pay | Admitting: Family Medicine

## 2021-01-07 DIAGNOSIS — I10 Essential (primary) hypertension: Secondary | ICD-10-CM

## 2021-01-24 ENCOUNTER — Other Ambulatory Visit: Payer: Self-pay | Admitting: Family

## 2021-01-24 DIAGNOSIS — M7021 Olecranon bursitis, right elbow: Secondary | ICD-10-CM

## 2021-02-27 ENCOUNTER — Other Ambulatory Visit: Payer: Self-pay | Admitting: Family

## 2021-02-27 DIAGNOSIS — M7021 Olecranon bursitis, right elbow: Secondary | ICD-10-CM

## 2021-03-26 ENCOUNTER — Other Ambulatory Visit: Payer: Self-pay | Admitting: Family

## 2021-03-26 DIAGNOSIS — M7021 Olecranon bursitis, right elbow: Secondary | ICD-10-CM

## 2021-03-29 ENCOUNTER — Other Ambulatory Visit: Payer: Self-pay | Admitting: Family Medicine

## 2021-03-29 DIAGNOSIS — I10 Essential (primary) hypertension: Secondary | ICD-10-CM

## 2021-04-02 NOTE — Telephone Encounter (Signed)
Called patient to schedule an appointment per Dr. Doreene Burke, no answer, voicemail not set up. Will try again later.

## 2021-04-08 ENCOUNTER — Other Ambulatory Visit: Payer: Self-pay | Admitting: Family

## 2021-04-08 DIAGNOSIS — I10 Essential (primary) hypertension: Secondary | ICD-10-CM

## 2021-04-09 ENCOUNTER — Other Ambulatory Visit: Payer: Self-pay | Admitting: Family Medicine

## 2021-04-09 DIAGNOSIS — F419 Anxiety disorder, unspecified: Secondary | ICD-10-CM

## 2021-04-09 DIAGNOSIS — F341 Dysthymic disorder: Secondary | ICD-10-CM

## 2021-04-12 NOTE — Telephone Encounter (Signed)
Appointment made for 6 mo/fu

## 2021-04-21 ENCOUNTER — Other Ambulatory Visit: Payer: Self-pay | Admitting: Family Medicine

## 2021-04-21 DIAGNOSIS — E7800 Pure hypercholesterolemia, unspecified: Secondary | ICD-10-CM

## 2021-04-25 ENCOUNTER — Other Ambulatory Visit: Payer: Self-pay

## 2021-04-26 ENCOUNTER — Other Ambulatory Visit: Payer: Self-pay | Admitting: Family Medicine

## 2021-04-26 ENCOUNTER — Encounter: Payer: Self-pay | Admitting: Family Medicine

## 2021-04-26 ENCOUNTER — Ambulatory Visit (INDEPENDENT_AMBULATORY_CARE_PROVIDER_SITE_OTHER): Payer: No Typology Code available for payment source | Admitting: Family Medicine

## 2021-04-26 VITALS — BP 136/84 | HR 79 | Temp 97.8°F | Ht 70.0 in | Wt 215.0 lb

## 2021-04-26 DIAGNOSIS — F341 Dysthymic disorder: Secondary | ICD-10-CM

## 2021-04-26 DIAGNOSIS — E559 Vitamin D deficiency, unspecified: Secondary | ICD-10-CM

## 2021-04-26 DIAGNOSIS — F419 Anxiety disorder, unspecified: Secondary | ICD-10-CM

## 2021-04-26 DIAGNOSIS — I10 Essential (primary) hypertension: Secondary | ICD-10-CM | POA: Diagnosis not present

## 2021-04-26 DIAGNOSIS — Z8739 Personal history of other diseases of the musculoskeletal system and connective tissue: Secondary | ICD-10-CM

## 2021-04-26 DIAGNOSIS — Z0000 Encounter for general adult medical examination without abnormal findings: Secondary | ICD-10-CM

## 2021-04-26 DIAGNOSIS — E7800 Pure hypercholesterolemia, unspecified: Secondary | ICD-10-CM

## 2021-04-26 DIAGNOSIS — Z125 Encounter for screening for malignant neoplasm of prostate: Secondary | ICD-10-CM | POA: Diagnosis not present

## 2021-04-26 LAB — URINALYSIS, ROUTINE W REFLEX MICROSCOPIC
Bilirubin Urine: NEGATIVE
Hgb urine dipstick: NEGATIVE
Ketones, ur: NEGATIVE
Leukocytes,Ua: NEGATIVE
Nitrite: NEGATIVE
RBC / HPF: NONE SEEN (ref 0–?)
Specific Gravity, Urine: 1.015 (ref 1.000–1.030)
Total Protein, Urine: NEGATIVE
Urine Glucose: NEGATIVE
Urobilinogen, UA: 0.2 (ref 0.0–1.0)
WBC, UA: NONE SEEN (ref 0–?)
pH: 7 (ref 5.0–8.0)

## 2021-04-26 MED ORDER — VENLAFAXINE HCL ER 75 MG PO CP24: ORAL_CAPSULE | ORAL | 1 refills | Status: DC

## 2021-04-26 NOTE — Progress Notes (Addendum)
Established Patient Office Visit  Subjective:  Patient ID: Henry Hernandez, male    DOB: 1966/01/05  Age: 55 y.o. MRN: [id number]  CC:  Chief Complaint  Patient presents with  . Follow-up    6 month follow up on BP and labs, no concerns patient fasting.     HPI [name] presents for follow-up of multiple problems.  Anxiety and depression persist.  He has been anxious for most of his life.  Admits that the Prozac has helped some with both.  He is sleeping better at night.  He has gained weight though.  He is also recovering from an occult fracture of his left foot.  Was unable to exercise as much as he would like.  Foot is improved though now he would like to just stop the Prozac.  This is been a bad year.  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  She is an Geophysicist/field seismologist living well.  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  Continues with allopurinol for gout but discontinued colchicine as planned for days.  Continues metoprolol and olmesartan with HCTZ.  Cholesterol has been controlled atorvastatin.  He takes meloxicam as needed for joint aches and pains.  Vitamin D levels were high in the past.  We will recheck this today  Past Medical History:  Diagnosis Date  . Allergy   . Chicken pox   . Hyperlipidemia   . Hypertension     Past Surgical History:  Procedure Laterality Date  . BACK SURGERY      Family History  Problem Relation Age of Onset  . Arthritis Mother   . COPD Mother   . Diabetes Mother   . Heart attack Mother   . Heart disease Mother   . Hyperlipidemia Mother   . Hypertension Mother   . Kidney disease Mother   . Stroke Mother   . Hypertension Father   . Hypertension Sister   . Hypertension Brother   . Cancer Maternal Grandmother   . Hypertension Maternal Grandmother     Social History   Socioeconomic History  . Marital status: Married    Spouse name: Not on file  . Number of children: Not on file  . Years of education: Not on file   . Highest education level: Not on file  Occupational History  . Not on file  Tobacco Use  . Smoking status: Never Smoker  . Smokeless tobacco: Never Used  Vaping Use  . Vaping Use: Never used  Substance and Sexual Activity  . Alcohol use: Not Currently  . Drug use: Not Currently  . Sexual activity: Not on file  Other Topics Concern  . Not on file  Social History Narrative  . Not on file   Social Determinants of Health   Financial Resource Strain: Not on file  Food Insecurity: Not on file  Transportation Needs: Not on file  Physical Activity: Not on file  Stress: Not on file  Social Connections: Not on file  Intimate Partner Violence: Not on file    Outpatient Medications Prior to Visit  Medication Sig Dispense Refill  . allopurinol (ZYLOPRIM) 300 MG tablet Take 1 tablet (300 mg total) by mouth daily. 90 tablet 6  . aspirin EC 81 MG tablet Take 1 tablet by mouth daily.    [name] atorvastatin (LIPITOR) 20 MG tablet TAKE 1 TABLET BY MOUTH EVERY DAY 90 tablet 2  . cyanocobalamin (,VITAMIN B-12,) 1000 MCG/ML injection INJECT 1 ML (  1,000 MCG TOTAL) INTO THE MUSCLE ONCE FOR 1 DOSE.    . meloxicam (MOBIC) 15 MG tablet TAKE 1 TABLET BY MOUTH DAILY AS NEEDED WITH FOOD. 30 tablet 0  . metoprolol succinate (TOPROL-XL) 25 MG 24 hr tablet TAKE 1 TABLET BY MOUTH EVERY DAY 90 tablet 0  . olmesartan-hydrochlorothiazide (BENICAR HCT) 40-25 MG tablet TAKE 1 TABLET BY MOUTH EVERY DAY 90 tablet 0  . FLUoxetine (PROZAC) 10 MG capsule TAKE 2 CAPSULES BY MOUTH DAILY 180 capsule 0  . Calcium Carb-Cholecalciferol 600-400 MG-UNIT TABS Take 1 tablet by mouth 2 (two) times daily. (Patient not taking: Reported on 04/26/2021)    . Calcium Carbonate-Vitamin D 600-400 MG-UNIT tablet Take 1 tablet by mouth 2 (two) times daily. (Patient not taking: No sig reported) 60 tablet 11  . colchicine 0.6 MG tablet Take 1 tablet (0.6 mg total) by mouth daily. (Patient not taking: Reported on 04/26/2021) 90 tablet 0  .  HYDROcodone-acetaminophen (NORCO/VICODIN) 5-325 MG tablet Take 1 tablet by mouth every 6 (six) hours as needed. (Patient not taking: No sig reported) 10 tablet 0   No facility-administered medications prior to visit.    No Known Allergies  ROS Review of Systems  Constitutional: Negative.   HENT: Negative.   Eyes: Negative for photophobia and visual disturbance.  Respiratory: Negative.   Cardiovascular: Negative.   Gastrointestinal: Negative.   Endocrine: Negative for polyphagia and polyuria.  Genitourinary: Negative.   Musculoskeletal: Positive for arthralgias. Negative for gait problem.  Neurological: Negative for speech difficulty and weakness.  Psychiatric/Behavioral: Positive for dysphoric mood. The patient is nervous/anxious.       Objective:    Physical Exam Vitals and nursing note reviewed.  Constitutional:      General: He is not in acute distress.    Appearance: Normal appearance. He is not ill-appearing, toxic-appearing or diaphoretic.  HENT:     Head: Normocephalic and atraumatic.     Right Ear: Tympanic membrane, ear canal and external ear normal.     Left Ear: Tympanic membrane, ear canal and external ear normal.     Mouth/Throat:     Mouth: Mucous membranes are dry.     Pharynx: Oropharynx is clear. No oropharyngeal exudate or posterior oropharyngeal erythema.  Eyes:     General: No scleral icterus.       Right eye: No discharge.        Left eye: No discharge.     Extraocular Movements: Extraocular movements intact.     Conjunctiva/sclera: Conjunctivae normal.     Pupils: Pupils are equal, round, and reactive to light.  Neck:     Vascular: No carotid bruit.  Cardiovascular:     Rate and Rhythm: Normal rate and regular rhythm.  Pulmonary:     Effort: Pulmonary effort is normal.     Breath sounds: Normal breath sounds.  Abdominal:     General: Bowel sounds are normal.  Musculoskeletal:     Cervical back: No rigidity or tenderness.  Lymphadenopathy:      Cervical: No cervical adenopathy.  Skin:    General: Skin is warm and dry.  Neurological:     Mental Status: He is alert and oriented to person, place, and time.  Psychiatric:        Attention and Perception: Attention normal.        Mood and Affect: Mood is anxious.     BP 136/84   Pulse 79   Temp 97.8 F (36.6 C) (Temporal)   Ht 5'  10" (1.778 m)   Wt 215 lb (97.5 kg)   SpO2 93%   BMI 30.85 kg/m  Wt Readings from Last 3 Encounters:  04/26/21 215 lb (97.5 kg)  09/20/20 198 lb 12.8 oz (90.2 kg)  05/30/20 189 lb (85.7 kg)     Health Maintenance Due  Topic Date Due  . Hepatitis C Screening  Never done    There are no preventive care reminders to display for this patient.  Lab Results  Component Value Date   TSH 2.29 03/24/2020   Lab Results  Component Value Date   WBC 4.0 09/20/2020   HGB 13.1 09/20/2020   HCT 37.9 (L) 09/20/2020   MCV 87.7 09/20/2020   PLT 154.0 09/20/2020   Lab Results  Component Value Date   NA 141 09/20/2020   K 3.7 09/20/2020   CO2 33 (H) 09/20/2020   GLUCOSE 104 (H) 09/20/2020   BUN 20 09/20/2020   CREATININE 1.18 09/20/2020   BILITOT 0.8 09/20/2020   ALKPHOS 68 09/20/2020   AST 14 09/20/2020   ALT 21 09/20/2020   PROT 7.1 09/20/2020   ALBUMIN 4.5 09/20/2020   CALCIUM 9.8 09/20/2020   GFR 64.27 09/20/2020   Lab Results  Component Value Date   CHOL 167 09/20/2020   Lab Results  Component Value Date   HDL 44.20 09/20/2020   Lab Results  Component Value Date   LDLCALC 83 09/20/2020   Lab Results  Component Value Date   TRIG 198.0 (H) 09/20/2020   Lab Results  Component Value Date   CHOLHDL 4 09/20/2020   Lab Results  Component Value Date   HGBA1C 5.9 03/24/2020      Assessment & Plan:   Problem List Items Addressed This Visit      Cardiovascular and Mediastinum   Essential hypertension   Relevant Orders   CBC   Comprehensive metabolic panel   Urinalysis, Routine w reflex microscopic (Completed)      Other   Elevated cholesterol   Relevant Orders   Comprehensive metabolic panel   Lipid panel   Vitamin D deficiency   Relevant Orders   VITAMIN D 25 Hydroxy (Vit-D Deficiency, Fractures)   Healthcare maintenance   Relevant Orders   PSA   Anxiety   Relevant Medications   venlafaxine XR (EFFEXOR XR) 75 MG 24 hr capsule   Dysthymia - Primary   Relevant Medications   venlafaxine XR (EFFEXOR XR) 75 MG 24 hr capsule   History of gout   Relevant Orders   Comprehensive metabolic panel   Uric acid      Meds ordered this encounter  Medications  . venlafaxine XR (EFFEXOR XR) 75 MG 24 hr capsule    Sig: Take one capsule each morning for a week and then 2 each morning.    Dispense:  60 capsule    Refill:  1    Follow-up: Return in about 5 weeks (around 05/31/2021).  Given information on venlafaxine.  He will keep an eye on his blood pressure. venlafaxime er 75 q d x one week and then 150 q #90 Mliss Sax, MD

## 2021-04-27 LAB — COMPREHENSIVE METABOLIC PANEL
ALT: 37 U/L (ref 0–53)
AST: 20 U/L (ref 0–37)
Albumin: 4.6 g/dL (ref 3.5–5.2)
Alkaline Phosphatase: 63 U/L (ref 39–117)
BUN: 21 mg/dL (ref 6–23)
CO2: 28 mEq/L (ref 19–32)
Calcium: 10 mg/dL (ref 8.4–10.5)
Chloride: 103 mEq/L (ref 96–112)
Creatinine, Ser: 1.27 mg/dL (ref 0.40–1.50)
GFR: 63.89 mL/min (ref >60.00)
Glucose, Bld: 102 mg/dL — ABNORMAL HIGH (ref 70–99)
Potassium: 3.7 mEq/L (ref 3.5–5.1)
Sodium: 141 mEq/L (ref 135–145)
Total Bilirubin: 0.7 mg/dL (ref 0.2–1.2)
Total Protein: 7.1 g/dL (ref 6.0–8.3)

## 2021-04-27 LAB — CBC
HCT: 41.5 % (ref 39.0–52.0)
Hemoglobin: 13.9 g/dL (ref 13.0–17.0)
MCHC: 33.5 g/dL (ref 30.0–36.0)
MCV: 91.1 fl (ref 78.0–100.0)
Platelets: 167 10*3/uL (ref 150.0–400.0)
RBC: 4.55 Mil/uL (ref 4.22–5.81)
RDW: 14.1 % (ref 11.5–15.5)
WBC: 4 10*3/uL (ref 4.0–10.5)

## 2021-04-27 LAB — LIPID PANEL
Cholesterol: 200 mg/dL (ref 0–200)
HDL: 36.4 mg/dL — ABNORMAL LOW (ref >39.00)
NonHDL: 163.99
Total CHOL/HDL Ratio: 6
Triglycerides: 370 mg/dL — ABNORMAL HIGH (ref 0.0–149.0)
VLDL: 74 mg/dL — ABNORMAL HIGH (ref 0.0–40.0)

## 2021-04-27 LAB — VITAMIN D 25 HYDROXY (VIT D DEFICIENCY, FRACTURES): VITD: 22.54 ng/mL — ABNORMAL LOW (ref 30.00–100.00)

## 2021-04-27 LAB — LDL CHOLESTEROL, DIRECT: Direct LDL: 95 mg/dL

## 2021-04-27 LAB — PSA: PSA: 1.28 ng/mL (ref 0.10–4.00)

## 2021-04-27 LAB — URIC ACID: Uric Acid, Serum: 7.3 mg/dL (ref 4.0–7.8)

## 2021-05-01 ENCOUNTER — Telehealth: Payer: Self-pay | Admitting: Family Medicine

## 2021-05-01 NOTE — Telephone Encounter (Signed)
Please cb pt concerning his most recent lab results at [phone].

## 2021-05-04 ENCOUNTER — Telehealth: Payer: Self-pay | Admitting: Family Medicine

## 2021-05-04 ENCOUNTER — Other Ambulatory Visit: Payer: Self-pay

## 2021-05-04 DIAGNOSIS — I10 Essential (primary) hypertension: Secondary | ICD-10-CM

## 2021-05-04 MED ORDER — OLMESARTAN MEDOXOMIL-HCTZ 40-25 MG PO TABS: 1.0000 | ORAL_TABLET | Freq: Every day | ORAL | 0 refills | Status: DC

## 2021-05-04 NOTE — Telephone Encounter (Signed)
Pt called and needs refill on olmesartan-hydrochlorothiazide (BENICAR HCT) 40-25 MG tablet today if possible, he is out. [facility] [phone]

## 2021-05-04 NOTE — Telephone Encounter (Signed)
Refill sent in

## 2021-05-10 ENCOUNTER — Other Ambulatory Visit: Payer: Self-pay | Admitting: Family Medicine

## 2021-05-10 DIAGNOSIS — M7021 Olecranon bursitis, right elbow: Secondary | ICD-10-CM

## 2021-06-10 ENCOUNTER — Other Ambulatory Visit: Payer: Self-pay | Admitting: Family Medicine

## 2021-06-10 DIAGNOSIS — M7021 Olecranon bursitis, right elbow: Secondary | ICD-10-CM

## 2021-07-09 ENCOUNTER — Other Ambulatory Visit: Payer: Self-pay | Admitting: Family Medicine

## 2021-07-09 DIAGNOSIS — F419 Anxiety disorder, unspecified: Secondary | ICD-10-CM

## 2021-07-09 DIAGNOSIS — F341 Dysthymic disorder: Secondary | ICD-10-CM

## 2021-07-10 ENCOUNTER — Other Ambulatory Visit: Payer: Self-pay | Admitting: Family Medicine

## 2021-07-10 DIAGNOSIS — M7021 Olecranon bursitis, right elbow: Secondary | ICD-10-CM

## 2021-07-16 ENCOUNTER — Other Ambulatory Visit: Payer: Self-pay | Admitting: Family Medicine

## 2021-07-16 DIAGNOSIS — I10 Essential (primary) hypertension: Secondary | ICD-10-CM

## 2021-07-18 ENCOUNTER — Other Ambulatory Visit: Payer: Self-pay | Admitting: Family Medicine

## 2021-07-18 DIAGNOSIS — I10 Essential (primary) hypertension: Secondary | ICD-10-CM

## 2021-08-18 ENCOUNTER — Other Ambulatory Visit: Payer: Self-pay | Admitting: Family Medicine

## 2021-08-18 DIAGNOSIS — M7021 Olecranon bursitis, right elbow: Secondary | ICD-10-CM

## 2021-08-29 ENCOUNTER — Other Ambulatory Visit: Payer: Self-pay

## 2021-08-30 ENCOUNTER — Ambulatory Visit (INDEPENDENT_AMBULATORY_CARE_PROVIDER_SITE_OTHER): Payer: No Typology Code available for payment source | Admitting: Family Medicine

## 2021-08-30 ENCOUNTER — Encounter: Payer: Self-pay | Admitting: Family Medicine

## 2021-08-30 VITALS — BP 146/78 | HR 90 | Temp 97.5°F | Ht 70.0 in | Wt 219.4 lb

## 2021-08-30 DIAGNOSIS — T675XXA Heat exhaustion, unspecified, initial encounter: Secondary | ICD-10-CM | POA: Insufficient documentation

## 2021-08-30 DIAGNOSIS — T675XXS Heat exhaustion, unspecified, sequela: Secondary | ICD-10-CM | POA: Diagnosis not present

## 2021-08-30 DIAGNOSIS — F5104 Psychophysiologic insomnia: Secondary | ICD-10-CM

## 2021-08-30 DIAGNOSIS — F341 Dysthymic disorder: Secondary | ICD-10-CM | POA: Diagnosis not present

## 2021-08-30 DIAGNOSIS — F419 Anxiety disorder, unspecified: Secondary | ICD-10-CM | POA: Diagnosis not present

## 2021-08-30 DIAGNOSIS — F19939 Other psychoactive substance use, unspecified with withdrawal, unspecified: Secondary | ICD-10-CM

## 2021-08-30 DIAGNOSIS — F19239 Other psychoactive substance dependence with withdrawal, unspecified: Secondary | ICD-10-CM | POA: Insufficient documentation

## 2021-08-30 MED ORDER — ESZOPICLONE 2 MG PO TABS: 2.0000 mg | ORAL_TABLET | Freq: Every evening | ORAL | 0 refills | Status: DC | PRN

## 2021-08-30 NOTE — Progress Notes (Signed)
Established Patient Office Visit  Subjective:  Patient ID: Henry Hernandez, male    DOB: 28-Sep-1966  Age: 55 y.o. MRN: [id number]  CC:  Chief Complaint  Patient presents with   Follow-up    Follow up on medication, per patient effexor making him restless. Patient off x 1 week now he feels very tired and sweating a lot.     HPI THANG FLETT presents for acute insomnia.  Has been getting very little sleep over the last week or so.  Had not been sleeping well with the Effexor therapy and decided to self taper 10 to 14 days ago.  He had been taking it every other day and then stopped.  He also had worked outside this past Saturday and was profusely sweating.  Did not hydrate as well as he should have.  And has been fatigued.  Taking 5000 units of vitamin D 5 days weekly.  Work is been stressful.  He has not been exercising.  Weight is up.  Past Medical History:  Diagnosis Date   Allergy    Chicken pox    Hyperlipidemia    Hypertension     Past Surgical History:  Procedure Laterality Date   BACK SURGERY      Family History  Problem Relation Age of Onset   Arthritis Mother    COPD Mother    Diabetes Mother    Heart attack Mother    Heart disease Mother    Hyperlipidemia Mother    Hypertension Mother    Kidney disease Mother    Stroke Mother    Hypertension Father    Hypertension Sister    Hypertension Brother    Cancer Maternal Grandmother    Hypertension Maternal Grandmother     Social History   Socioeconomic History   Marital status: Married    Spouse name: Not on file   Number of children: Not on file   Years of education: Not on file   Highest education level: Not on file  Occupational History   Not on file  Tobacco Use   Smoking status: Never   Smokeless tobacco: Never  Vaping Use   Vaping Use: Never used  Substance and Sexual Activity   Alcohol use: Not Currently   Drug use: Not Currently   Sexual activity: Not on file  Other Topics Concern   Not on  file  Social History Narrative   Not on file   Social Determinants of Health   Financial Resource Strain: Not on file  Food Insecurity: Not on file  Transportation Needs: Not on file  Physical Activity: Not on file  Stress: Not on file  Social Connections: Not on file  Intimate Partner Violence: Not on file    Outpatient Medications Prior to Visit  Medication Sig Dispense Refill   allopurinol (ZYLOPRIM) 300 MG tablet Take 1 tablet (300 mg total) by mouth daily. 90 tablet 6   aspirin EC 81 MG tablet Take 1 tablet by mouth daily.     atorvastatin (LIPITOR) 20 MG tablet TAKE 1 TABLET BY MOUTH EVERY DAY 90 tablet 2   cyanocobalamin (,VITAMIN B-12,) 1000 MCG/ML injection INJECT 1 ML (1,000 MCG TOTAL) INTO THE MUSCLE ONCE FOR 1 DOSE.     meloxicam (MOBIC) 15 MG tablet TAKE 1 TABLET BY MOUTH DAILY AS NEEDED WITH FOOD. 30 tablet 0   metoprolol succinate (TOPROL-XL) 25 MG 24 hr tablet TAKE 1 TABLET BY MOUTH EVERY DAY 90 tablet 0   olmesartan-hydrochlorothiazide (BENICAR  HCT) 40-25 MG tablet TAKE 1 TABLET BY MOUTH EVERY DAY 90 tablet 0   venlafaxine XR (EFFEXOR XR) 75 MG 24 hr capsule Take one capsule each morning for a week and then 2 each morning. (Patient not taking: Reported on 08/30/2021) 60 capsule 1   No facility-administered medications prior to visit.    No Known Allergies  ROS Review of Systems  Constitutional:  Negative for chills, diaphoresis, fatigue, fever and unexpected weight change.  Respiratory: Negative.    Cardiovascular: Negative.   Gastrointestinal: Negative.   Psychiatric/Behavioral:  Positive for dysphoric mood and sleep disturbance. The patient is nervous/anxious.      Objective:    Physical Exam Vitals and nursing note reviewed.  Constitutional:      General: He is not in acute distress.    Appearance: Normal appearance. He is not ill-appearing, toxic-appearing or diaphoretic.  HENT:     Head: Normocephalic and atraumatic.  Cardiovascular:     Rate and  Rhythm: Normal rate and regular rhythm.  Pulmonary:     Breath sounds: Normal breath sounds.  Skin:    General: Skin is warm and dry.  Neurological:     General: No focal deficit present.     Mental Status: He is alert and oriented to person, place, and time.    BP (!) 146/78 (BP Location: Left Arm, Patient Position: Sitting, Cuff Size: Large)   Pulse 90   Temp (!) 97.5 F (36.4 C) (Temporal)   Ht 5\' 10"  (1.778 m)   Wt 219 lb 6.4 oz (99.5 kg)   SpO2 99%   BMI 31.48 kg/m  Wt Readings from Last 3 Encounters:  08/30/21 219 lb 6.4 oz (99.5 kg)  04/26/21 215 lb (97.5 kg)  09/20/20 198 lb 12.8 oz (90.2 kg)     Health Maintenance Due  Topic Date Due   Hepatitis C Screening  Never done   INFLUENZA VACCINE  07/30/2021    There are no preventive care reminders to display for this patient.  Lab Results  Component Value Date   TSH 2.29 03/24/2020   Lab Results  Component Value Date   WBC 4.0 04/26/2021   HGB 13.9 04/26/2021   HCT 41.5 04/26/2021   MCV 91.1 04/26/2021   PLT 167.0 04/26/2021   Lab Results  Component Value Date   NA 141 04/26/2021   K 3.7 04/26/2021   CO2 28 04/26/2021   GLUCOSE 102 (H) 04/26/2021   BUN 21 04/26/2021   CREATININE 1.27 04/26/2021   BILITOT 0.7 04/26/2021   ALKPHOS 63 04/26/2021   AST 20 04/26/2021   ALT 37 04/26/2021   PROT 7.1 04/26/2021   ALBUMIN 4.6 04/26/2021   CALCIUM 10.0 04/26/2021   GFR 63.89 04/26/2021   Lab Results  Component Value Date   CHOL 200 04/26/2021   Lab Results  Component Value Date   HDL 36.40 (L) 04/26/2021   Lab Results  Component Value Date   LDLCALC 83 09/20/2020   Lab Results  Component Value Date   TRIG 370.0 (H) 04/26/2021   Lab Results  Component Value Date   CHOLHDL 6 04/26/2021   Lab Results  Component Value Date   HGBA1C 5.9 03/24/2020      Assessment & Plan:   Problem List Items Addressed This Visit       Other   Anxiety   Dysthymia - Primary   Heat exhaustion    Psychophysiological insomnia   Relevant Medications   eszopiclone (LUNESTA) 2 MG TABS  tablet   Drug withdrawal syndrome (HCC)    Meds ordered this encounter  Medications   eszopiclone (LUNESTA) 2 MG TABS tablet    Sig: Take 1 tablet (2 mg total) by mouth at bedtime as needed for sleep. Take immediately before bedtime    Dispense:  15 tablet    Refill:  0    Follow-up: Return in about 1 month (around 09/29/2021).  Will restart Prozac 20 mg daily.  We will use Lunesta as needed.  Follow-up in 1 month or sooner to reevaluate stress and dysthymia.   Mliss Sax, MD

## 2021-09-11 ENCOUNTER — Other Ambulatory Visit: Payer: Self-pay | Admitting: Family Medicine

## 2021-09-11 DIAGNOSIS — M7021 Olecranon bursitis, right elbow: Secondary | ICD-10-CM

## 2021-10-02 ENCOUNTER — Other Ambulatory Visit: Payer: Self-pay

## 2021-10-02 ENCOUNTER — Encounter: Payer: Self-pay | Admitting: Family Medicine

## 2021-10-02 ENCOUNTER — Ambulatory Visit (INDEPENDENT_AMBULATORY_CARE_PROVIDER_SITE_OTHER): Payer: No Typology Code available for payment source | Admitting: Family Medicine

## 2021-10-02 VITALS — BP 150/92 | HR 60 | Temp 97.2°F | Ht 70.0 in | Wt 220.4 lb

## 2021-10-02 DIAGNOSIS — F419 Anxiety disorder, unspecified: Secondary | ICD-10-CM | POA: Diagnosis not present

## 2021-10-02 DIAGNOSIS — E559 Vitamin D deficiency, unspecified: Secondary | ICD-10-CM

## 2021-10-02 DIAGNOSIS — F341 Dysthymic disorder: Secondary | ICD-10-CM

## 2021-10-02 DIAGNOSIS — I10 Essential (primary) hypertension: Secondary | ICD-10-CM | POA: Diagnosis not present

## 2021-10-02 DIAGNOSIS — E7800 Pure hypercholesterolemia, unspecified: Secondary | ICD-10-CM | POA: Diagnosis not present

## 2021-10-02 LAB — BASIC METABOLIC PANEL
BUN: 20 mg/dL (ref 6–23)
CO2: 30 mEq/L (ref 19–32)
Calcium: 9.3 mg/dL (ref 8.4–10.5)
Chloride: 102 mEq/L (ref 96–112)
Creatinine, Ser: 1.29 mg/dL (ref 0.40–1.50)
GFR: 62.51 mL/min (ref >60.00)
Glucose, Bld: 136 mg/dL — ABNORMAL HIGH (ref 70–99)
Potassium: 3.9 mEq/L (ref 3.5–5.1)
Sodium: 141 mEq/L (ref 135–145)

## 2021-10-02 LAB — LIPID PANEL
Cholesterol: 185 mg/dL (ref 0–200)
HDL: 36.5 mg/dL — ABNORMAL LOW (ref >39.00)
NonHDL: 148.5
Total CHOL/HDL Ratio: 5
Triglycerides: 361 mg/dL — ABNORMAL HIGH (ref 0.0–149.0)
VLDL: 72.2 mg/dL — ABNORMAL HIGH (ref 0.0–40.0)

## 2021-10-02 LAB — LDL CHOLESTEROL, DIRECT: Direct LDL: 93 mg/dL

## 2021-10-02 LAB — VITAMIN D 25 HYDROXY (VIT D DEFICIENCY, FRACTURES): VITD: 35.67 ng/mL (ref 30.00–100.00)

## 2021-10-02 MED ORDER — AMLODIPINE BESYLATE 5 MG PO TABS: 5.0000 mg | ORAL_TABLET | Freq: Every day | ORAL | 1 refills | Status: DC

## 2021-10-02 NOTE — Progress Notes (Signed)
Established Patient Office Visit  Subjective:  Patient ID: Henry Hernandez, male    DOB: 03-30-1966  Age: 55 y.o. MRN: [id number]  CC:  Chief Complaint  Patient presents with   Follow-up    1 month follow up, no concerns. Patient fasting.     HPI Henry Hernandez presents for follow-up of dysthymia anxiety, hypertension, vitamin D deficiency and hyperlipidemia.  Patient is fasting this morning.  Has been taking his atorvastatin daily.  Continues with 5000 units of vitamin D 3 times weekly.  Compliant with olmesartan/HCTZ and metoprolol 25.  Of course work is stressful for him.  He is considering cutting back on his hours.  Feels as though depression is waning.  Takes Prozac at night, as he feels as though it helps him sleep.  Alfonso Patten was not helpful.  Past Medical History:  Diagnosis Date   Allergy    Chicken pox    Hyperlipidemia    Hypertension     Past Surgical History:  Procedure Laterality Date   BACK SURGERY      Family History  Problem Relation Age of Onset   Arthritis Mother    COPD Mother    Diabetes Mother    Heart attack Mother    Heart disease Mother    Hyperlipidemia Mother    Hypertension Mother    Kidney disease Mother    Stroke Mother    Hypertension Father    Hypertension Sister    Hypertension Brother    Cancer Maternal Grandmother    Hypertension Maternal Grandmother     Social History   Socioeconomic History   Marital status: Married    Spouse name: Not on file   Number of children: Not on file   Years of education: Not on file   Highest education level: Not on file  Occupational History   Not on file  Tobacco Use   Smoking status: Never   Smokeless tobacco: Never  Vaping Use   Vaping Use: Never used  Substance and Sexual Activity   Alcohol use: Not Currently   Drug use: Not Currently   Sexual activity: Not on file  Other Topics Concern   Not on file  Social History Narrative   Not on file   Social Determinants of Health    Financial Resource Strain: Not on file  Food Insecurity: Not on file  Transportation Needs: Not on file  Physical Activity: Not on file  Stress: Not on file  Social Connections: Not on file  Intimate Partner Violence: Not on file    Outpatient Medications Prior to Visit  Medication Sig Dispense Refill   allopurinol (ZYLOPRIM) 300 MG tablet Take 1 tablet (300 mg total) by mouth daily. 90 tablet 6   aspirin EC 81 MG tablet Take 1 tablet by mouth daily.     atorvastatin (LIPITOR) 20 MG tablet TAKE 1 TABLET BY MOUTH EVERY DAY 90 tablet 2   cyanocobalamin (,VITAMIN B-12,) 1000 MCG/ML injection INJECT 1 ML (1,000 MCG TOTAL) INTO THE MUSCLE ONCE FOR 1 DOSE.     FLUoxetine (PROZAC) 10 MG tablet Take 10 mg by mouth in the morning and at bedtime.     meloxicam (MOBIC) 15 MG tablet TAKE 1 TABLET BY MOUTH DAILY AS NEEDED WITH FOOD. 30 tablet 0   metoprolol succinate (TOPROL-XL) 25 MG 24 hr tablet TAKE 1 TABLET BY MOUTH EVERY DAY 90 tablet 0   olmesartan-hydrochlorothiazide (BENICAR HCT) 40-25 MG tablet TAKE 1 TABLET BY MOUTH EVERY DAY 90  tablet 0   eszopiclone (LUNESTA) 2 MG TABS tablet Take 1 tablet (2 mg total) by mouth at bedtime as needed for sleep. Take immediately before bedtime 15 tablet 0   venlafaxine XR (EFFEXOR XR) 75 MG 24 hr capsule Take one capsule each morning for a week and then 2 each morning. (Patient not taking: Reported on 08/30/2021) 60 capsule 1   No facility-administered medications prior to visit.    No Known Allergies  ROS Review of Systems  Constitutional:  Negative for diaphoresis, fatigue, fever and unexpected weight change.  HENT: Negative.    Eyes:  Negative for photophobia and visual disturbance.  Respiratory: Negative.    Cardiovascular: Negative.   Gastrointestinal: Negative.   Genitourinary: Negative.   Neurological:  Negative for speech difficulty and weakness.  Psychiatric/Behavioral:  Positive for dysphoric mood. The patient is nervous/anxious.       Objective:    Physical Exam Vitals and nursing note reviewed.  Constitutional:      General: He is not in acute distress.    Appearance: Normal appearance. He is not ill-appearing, toxic-appearing or diaphoretic.  HENT:     Head: Normocephalic and atraumatic.     Right Ear: Tympanic membrane, ear canal and external ear normal.     Left Ear: Tympanic membrane, ear canal and external ear normal.     Mouth/Throat:     Mouth: Mucous membranes are moist.     Pharynx: Oropharynx is clear. No oropharyngeal exudate or posterior oropharyngeal erythema.  Eyes:     General: No scleral icterus.       Right eye: No discharge.        Left eye: No discharge.     Extraocular Movements: Extraocular movements intact.     Conjunctiva/sclera: Conjunctivae normal.     Pupils: Pupils are equal, round, and reactive to light.  Cardiovascular:     Rate and Rhythm: Normal rate and regular rhythm.  Pulmonary:     Effort: Pulmonary effort is normal.     Breath sounds: Normal breath sounds.  Musculoskeletal:     Cervical back: No rigidity or tenderness.  Lymphadenopathy:     Cervical: No cervical adenopathy.  Skin:    General: Skin is warm and dry.  Neurological:     Mental Status: He is alert and oriented to person, place, and time.  Psychiatric:        Mood and Affect: Mood normal.        Behavior: Behavior normal.    BP (!) 150/92 (BP Location: Left Arm, Patient Position: Sitting, Cuff Size: Normal)   Pulse 60   Temp (!) 97.2 F (36.2 C) (Temporal)   Ht 5\' 10"  (1.778 m)   Wt 220 lb 6.4 oz (100 kg)   SpO2 97%   BMI 31.62 kg/m  Wt Readings from Last 3 Encounters:  10/02/21 220 lb 6.4 oz (100 kg)  08/30/21 219 lb 6.4 oz (99.5 kg)  04/26/21 215 lb (97.5 kg)     Health Maintenance Due  Topic Date Due   Hepatitis C Screening  Never done   INFLUENZA VACCINE  07/30/2021    There are no preventive care reminders to display for this patient.  Lab Results  Component Value Date   TSH  2.29 03/24/2020   Lab Results  Component Value Date   WBC 4.0 04/26/2021   HGB 13.9 04/26/2021   HCT 41.5 04/26/2021   MCV 91.1 04/26/2021   PLT 167.0 04/26/2021   Lab Results  Component  Value Date   NA 141 04/26/2021   K 3.7 04/26/2021   CO2 28 04/26/2021   GLUCOSE 102 (H) 04/26/2021   BUN 21 04/26/2021   CREATININE 1.27 04/26/2021   BILITOT 0.7 04/26/2021   ALKPHOS 63 04/26/2021   AST 20 04/26/2021   ALT 37 04/26/2021   PROT 7.1 04/26/2021   ALBUMIN 4.6 04/26/2021   CALCIUM 10.0 04/26/2021   GFR 63.89 04/26/2021   Lab Results  Component Value Date   CHOL 200 04/26/2021   Lab Results  Component Value Date   HDL 36.40 (L) 04/26/2021   Lab Results  Component Value Date   LDLCALC 83 09/20/2020   Lab Results  Component Value Date   TRIG 370.0 (H) 04/26/2021   Lab Results  Component Value Date   CHOLHDL 6 04/26/2021   Lab Results  Component Value Date   HGBA1C 5.9 03/24/2020      Assessment & Plan:   Problem List Items Addressed This Visit       Cardiovascular and Mediastinum   Essential hypertension   Relevant Medications   amLODipine (NORVASC) 5 MG tablet   Other Relevant Orders   Basic metabolic panel     Other   Elevated cholesterol   Relevant Medications   amLODipine (NORVASC) 5 MG tablet   Other Relevant Orders   Lipid panel   Vitamin D deficiency   Relevant Orders   VITAMIN D 25 Hydroxy (Vit-D Deficiency, Fractures)   Anxiety   Relevant Medications   FLUoxetine (PROZAC) 10 MG tablet   Dysthymia - Primary   Relevant Medications   FLUoxetine (PROZAC) 10 MG tablet    Meds ordered this encounter  Medications   amLODipine (NORVASC) 5 MG tablet    Sig: Take 1 tablet (5 mg total) by mouth daily.    Dispense:  90 tablet    Refill:  1    Follow-up: Return in about 3 months (around 01/02/2022).  Have added amlodipine 5 mg daily to his regimen.  He will try to lose some weight.  Information given on managing hypertension.  Rechecking  vitamin D level.  Rechecking triglycerides.  Mliss Sax, MD

## 2021-10-17 ENCOUNTER — Other Ambulatory Visit: Payer: Self-pay | Admitting: Family Medicine

## 2021-10-17 DIAGNOSIS — Z8739 Personal history of other diseases of the musculoskeletal system and connective tissue: Secondary | ICD-10-CM

## 2021-10-17 DIAGNOSIS — I10 Essential (primary) hypertension: Secondary | ICD-10-CM

## 2021-10-23 ENCOUNTER — Other Ambulatory Visit: Payer: Self-pay | Admitting: Family Medicine

## 2021-10-23 DIAGNOSIS — F419 Anxiety disorder, unspecified: Secondary | ICD-10-CM

## 2021-10-23 DIAGNOSIS — F341 Dysthymic disorder: Secondary | ICD-10-CM

## 2021-10-28 ENCOUNTER — Other Ambulatory Visit: Payer: Self-pay | Admitting: Family Medicine

## 2021-10-28 DIAGNOSIS — M7021 Olecranon bursitis, right elbow: Secondary | ICD-10-CM

## 2021-11-29 ENCOUNTER — Other Ambulatory Visit: Payer: Self-pay | Admitting: Family Medicine

## 2021-11-29 DIAGNOSIS — M7021 Olecranon bursitis, right elbow: Secondary | ICD-10-CM

## 2022-01-08 ENCOUNTER — Other Ambulatory Visit: Payer: Self-pay

## 2022-01-08 ENCOUNTER — Ambulatory Visit (INDEPENDENT_AMBULATORY_CARE_PROVIDER_SITE_OTHER): Payer: No Typology Code available for payment source | Admitting: Family Medicine

## 2022-01-08 ENCOUNTER — Encounter: Payer: Self-pay | Admitting: Family Medicine

## 2022-01-08 VITALS — BP 140/80 | HR 61 | Temp 97.0°F | Ht 70.0 in | Wt 223.8 lb

## 2022-01-08 DIAGNOSIS — F419 Anxiety disorder, unspecified: Secondary | ICD-10-CM | POA: Diagnosis not present

## 2022-01-08 DIAGNOSIS — E559 Vitamin D deficiency, unspecified: Secondary | ICD-10-CM

## 2022-01-08 DIAGNOSIS — F341 Dysthymic disorder: Secondary | ICD-10-CM

## 2022-01-08 DIAGNOSIS — E7800 Pure hypercholesterolemia, unspecified: Secondary | ICD-10-CM

## 2022-01-08 DIAGNOSIS — R7309 Other abnormal glucose: Secondary | ICD-10-CM | POA: Diagnosis not present

## 2022-01-08 DIAGNOSIS — R6882 Decreased libido: Secondary | ICD-10-CM

## 2022-01-08 DIAGNOSIS — I10 Essential (primary) hypertension: Secondary | ICD-10-CM

## 2022-01-08 DIAGNOSIS — Z8739 Personal history of other diseases of the musculoskeletal system and connective tissue: Secondary | ICD-10-CM

## 2022-01-08 DIAGNOSIS — Z1159 Encounter for screening for other viral diseases: Secondary | ICD-10-CM

## 2022-01-08 LAB — CBC
HCT: 37.1 % — ABNORMAL LOW (ref 39.0–52.0)
Hemoglobin: 12.7 g/dL — ABNORMAL LOW (ref 13.0–17.0)
MCHC: 34.2 g/dL (ref 30.0–36.0)
MCV: 86.9 fl (ref 78.0–100.0)
Platelets: 156 10*3/uL (ref 150.0–400.0)
RBC: 4.26 Mil/uL (ref 4.22–5.81)
RDW: 13.3 % (ref 11.5–15.5)
WBC: 3.5 10*3/uL — ABNORMAL LOW (ref 4.0–10.5)

## 2022-01-08 LAB — COMPREHENSIVE METABOLIC PANEL
ALT: 48 U/L (ref 0–53)
AST: 23 U/L (ref 0–37)
Albumin: 4.4 g/dL (ref 3.5–5.2)
Alkaline Phosphatase: 65 U/L (ref 39–117)
BUN: 24 mg/dL — ABNORMAL HIGH (ref 6–23)
CO2: 28 mEq/L (ref 19–32)
Calcium: 9.2 mg/dL (ref 8.4–10.5)
Chloride: 104 mEq/L (ref 96–112)
Creatinine, Ser: 1.26 mg/dL (ref 0.40–1.50)
GFR: 64.18 mL/min (ref >60.00)
Glucose, Bld: 151 mg/dL — ABNORMAL HIGH (ref 70–99)
Potassium: 3.4 mEq/L — ABNORMAL LOW (ref 3.5–5.1)
Sodium: 140 mEq/L (ref 135–145)
Total Bilirubin: 0.7 mg/dL (ref 0.2–1.2)
Total Protein: 6.8 g/dL (ref 6.0–8.3)

## 2022-01-08 LAB — LIPID PANEL
Cholesterol: 162 mg/dL (ref 0–200)
HDL: 31.8 mg/dL — ABNORMAL LOW (ref >39.00)
NonHDL: 130.32
Total CHOL/HDL Ratio: 5
Triglycerides: 296 mg/dL — ABNORMAL HIGH (ref 0.0–149.0)
VLDL: 59.2 mg/dL — ABNORMAL HIGH (ref 0.0–40.0)

## 2022-01-08 LAB — LDL CHOLESTEROL, DIRECT: Direct LDL: 83 mg/dL

## 2022-01-08 LAB — URIC ACID: Uric Acid, Serum: 6.7 mg/dL (ref 4.0–7.8)

## 2022-01-08 LAB — VITAMIN D 25 HYDROXY (VIT D DEFICIENCY, FRACTURES): VITD: 32.32 ng/mL (ref 30.00–100.00)

## 2022-01-08 LAB — HEMOGLOBIN A1C: Hgb A1c MFr Bld: 7.4 % — ABNORMAL HIGH (ref 4.6–6.5)

## 2022-01-08 MED ORDER — AMLODIPINE BESYLATE 10 MG PO TABS: 10.0000 mg | ORAL_TABLET | Freq: Every day | ORAL | 1 refills | Status: DC

## 2022-01-08 NOTE — Progress Notes (Signed)
Established Patient Office Visit  Subjective:  Patient ID: Henry Hernandez, male    DOB: [birth year]  Age: 56 y.o. MRN: [id number]  CC:  Chief Complaint  Patient presents with   Follow-up    3 month follow up , no concerns patient fasting for labs.     HPI AMAURIS GERENA presents for follow-up of hypertension, elevated cholesterol with hypertriglyceridemia, depression with anxiety, stress, history of gout, vitamin D deficiency, elevated glucose, weight gain.  Blood pressure has been running in the 140s over 80s to 90 on current regimen.  Tolerating the amlodipine, Benicar with HCTZ and metoprolol well.  Cardiology had recommended that he continue the metoprolol for the duration for rate control.  Stress in his life has improved with his aunts passing.  No longer responsible for her care.  Continues to work full-time and has for the most part been enjoying it.  Weight has increased.  Exercise has decreased.  Has added 2 g of fish oil daily that hopefully will help to lower the triglycerides.  Feels as though depression and anxiety are adequately treated at this time and his current regimen.  Tells me that he is sleeping better.  Past Medical History:  Diagnosis Date   Allergy    Chicken pox    Hyperlipidemia    Hypertension     Past Surgical History:  Procedure Laterality Date   BACK SURGERY      Family History  Problem Relation Age of Onset   Arthritis Mother    COPD Mother    Diabetes Mother    Heart attack Mother    Heart disease Mother    Hyperlipidemia Mother    Hypertension Mother    Kidney disease Mother    Stroke Mother    Hypertension Father    Hypertension Sister    Hypertension Brother    Cancer Maternal Grandmother    Hypertension Maternal Grandmother     Social History   Socioeconomic History   Marital status: Married    Spouse name: Not on file   Number of children: Not on file   Years of education: Not on file   Highest education level: Not on file   Occupational History   Not on file  Tobacco Use   Smoking status: Never   Smokeless tobacco: Never  Vaping Use   Vaping Use: Never used  Substance and Sexual Activity   Alcohol use: Not Currently   Drug use: Not Currently   Sexual activity: Not on file  Other Topics Concern   Not on file  Social History Narrative   Not on file   Social Determinants of Health   Financial Resource Strain: Not on file  Food Insecurity: Not on file  Transportation Needs: Not on file  Physical Activity: Not on file  Stress: Not on file  Social Connections: Not on file  Intimate Partner Violence: Not on file    Outpatient Medications Prior to Visit  Medication Sig Dispense Refill   allopurinol (ZYLOPRIM) 300 MG tablet TAKE 1 TABLET BY MOUTH EVERY DAY 90 tablet 6   aspirin EC 81 MG tablet Take 1 tablet by mouth daily.     atorvastatin (LIPITOR) 20 MG tablet TAKE 1 TABLET BY MOUTH EVERY DAY 90 tablet 2   cyanocobalamin (,VITAMIN B-12,) 1000 MCG/ML injection INJECT 1 ML (1,000 MCG TOTAL) INTO THE MUSCLE ONCE FOR 1 DOSE.     FLUoxetine (PROZAC) 10 MG capsule TAKE 2 CAPSULES BY MOUTH EVERY DAY 180  capsule 0   meloxicam (MOBIC) 15 MG tablet TAKE 1 TABLET BY MOUTH DAILY AS NEEDED WITH FOOD. 30 tablet 0   metoprolol succinate (TOPROL-XL) 25 MG 24 hr tablet TAKE 1 TABLET BY MOUTH EVERY DAY 90 tablet 2   olmesartan-hydrochlorothiazide (BENICAR HCT) 40-25 MG tablet TAKE 1 TABLET BY MOUTH EVERY DAY 90 tablet 2   amLODipine (NORVASC) 5 MG tablet Take 1 tablet (5 mg total) by mouth daily. 90 tablet 1   FLUoxetine (PROZAC) 10 MG tablet Take 10 mg by mouth in the morning and at bedtime.     No facility-administered medications prior to visit.    No Known Allergies  ROS Review of Systems  Constitutional:  Positive for fatigue. Negative for diaphoresis, fever and unexpected weight change.  HENT: Negative.    Eyes:  Negative for photophobia and visual disturbance.  Respiratory: Negative.     Cardiovascular: Negative.   Gastrointestinal: Negative.   Endocrine: Negative for polyphagia and polyuria.  Genitourinary: Negative.   Musculoskeletal:  Negative for gait problem and joint swelling.  Skin:  Negative for pallor and rash.  Neurological:  Negative for speech difficulty and weakness.     Depression screen [facility] 2/9 01/08/2022 10/02/2021 08/30/2021  Decreased Interest 0 0 1  Down, Depressed, Hopeless 0 0 1  PHQ - 2 Score 0 0 2  Altered sleeping - - 3  Tired, decreased energy - - 2  Change in appetite - - 1  Feeling bad or failure about yourself  - - 0  Trouble concentrating - - 2  Moving slowly or fidgety/restless - - 2  Suicidal thoughts - - 0  PHQ-9 Score - - 12  Difficult doing work/chores - - Somewhat difficult     Objective:    Physical Exam Vitals and nursing note reviewed.  Constitutional:      General: He is not in acute distress.    Appearance: Normal appearance. He is not ill-appearing, toxic-appearing or diaphoretic.  HENT:     Head: Normocephalic and atraumatic.     Right Ear: Tympanic membrane, ear canal and external ear normal.     Left Ear: Tympanic membrane, ear canal and external ear normal.     Mouth/Throat:     Mouth: Mucous membranes are moist.     Pharynx: Oropharynx is clear. No oropharyngeal exudate or posterior oropharyngeal erythema.  Eyes:     General: No scleral icterus.       Right eye: No discharge.        Left eye: No discharge.     Extraocular Movements: Extraocular movements intact.     Conjunctiva/sclera: Conjunctivae normal.     Pupils: Pupils are equal, round, and reactive to light.  Neck:     Vascular: No carotid bruit.  Cardiovascular:     Rate and Rhythm: Normal rate and regular rhythm.  Pulmonary:     Effort: Pulmonary effort is normal.     Breath sounds: Normal breath sounds.  Abdominal:     General: Bowel sounds are normal.  Musculoskeletal:     Cervical back: No rigidity or tenderness.     Right lower leg: No  edema.     Left lower leg: No edema.  Lymphadenopathy:     Cervical: No cervical adenopathy.  Skin:    General: Skin is warm and dry.  Neurological:     Mental Status: He is alert and oriented to person, place, and time.  Psychiatric:        Mood and  Affect: Mood normal.        Behavior: Behavior normal.    BP 140/80 (BP Location: Right Arm, Patient Position: Sitting, Cuff Size: Normal)    Pulse 61    Temp (!) 97 F (36.1 C) (Temporal)    Ht 5\' 10"  (1.778 m)    Wt 223 lb 12.8 oz (101.5 kg)    SpO2 96%    BMI 32.11 kg/m  Wt Readings from Last 3 Encounters:  01/08/22 223 lb 12.8 oz (101.5 kg)  10/02/21 220 lb 6.4 oz (100 kg)  08/30/21 219 lb 6.4 oz (99.5 kg)     Health Maintenance Due  Topic Date Due   Hepatitis C Screening  Never done    There are no preventive care reminders to display for this patient.  Lab Results  Component Value Date   TSH 2.29 03/24/2020   Lab Results  Component Value Date   WBC 4.0 04/26/2021   HGB 13.9 04/26/2021   HCT 41.5 04/26/2021   MCV 91.1 04/26/2021   PLT 167.0 04/26/2021   Lab Results  Component Value Date   NA 141 10/02/2021   K 3.9 10/02/2021   CO2 30 10/02/2021   GLUCOSE 136 (H) 10/02/2021   BUN 20 10/02/2021   CREATININE 1.29 10/02/2021   BILITOT 0.7 04/26/2021   ALKPHOS 63 04/26/2021   AST 20 04/26/2021   ALT 37 04/26/2021   PROT 7.1 04/26/2021   ALBUMIN 4.6 04/26/2021   CALCIUM 9.3 10/02/2021   GFR 62.51 10/02/2021   Lab Results  Component Value Date   CHOL 185 10/02/2021   Lab Results  Component Value Date   HDL 36.50 (L) 10/02/2021   Lab Results  Component Value Date   LDLCALC 83 09/20/2020   Lab Results  Component Value Date   TRIG 361.0 (H) 10/02/2021   Lab Results  Component Value Date   CHOLHDL 5 10/02/2021   Lab Results  Component Value Date   HGBA1C 5.9 03/24/2020      Assessment & Plan:   Problem List Items Addressed This Visit       Cardiovascular and Mediastinum   Essential  hypertension   Relevant Medications   amLODipine (NORVASC) 10 MG tablet   Other Relevant Orders   CBC   Comprehensive metabolic panel     Other   Elevated cholesterol   Relevant Medications   amLODipine (NORVASC) 10 MG tablet   Other Relevant Orders   Comprehensive metabolic panel   Lipid panel   Vitamin D deficiency   Relevant Orders   VITAMIN D 25 Hydroxy (Vit-D Deficiency, Fractures)   Encounter for hepatitis C screening test for low risk patient   Relevant Orders   Hepatitis C antibody   Elevated glucose   Relevant Orders   Comprehensive metabolic panel   Hemoglobin A1c   Anxiety   Dysthymia - Primary   History of gout   Relevant Orders   Uric acid   Libido, decreased   Relevant Orders   Testosterone Total,Free,Bio, Males-([facility])    Meds ordered this encounter  Medications   amLODipine (NORVASC) 10 MG tablet    Sig: Take 1 tablet (10 mg total) by mouth daily.    Dispense:  90 tablet    Refill:  1    Follow-up: Return in about 3 months (around 04/08/2022).   Blood pressure needs improved control.  Have increased amlodipine to 10 mg daily.  Continue Benicar with HCTZ and metoprolol.  Information was given on the Mediterranean  diet and exercising to lose weight.  Patient to exercise and pursue weight loss. Libby Maw, MD

## 2022-01-09 LAB — TESTOSTERONE TOTAL,FREE,BIO, MALES
Albumin: 4.4 g/dL (ref 3.6–5.1)
Sex Hormone Binding: 10 nmol/L (ref 10–50)
Testosterone: 179 ng/dL — ABNORMAL LOW (ref 250–827)

## 2022-01-09 LAB — HEPATITIS C ANTIBODY
Hepatitis C Ab: NONREACTIVE
SIGNAL TO CUT-OFF: 0.07 (ref <1.00)

## 2022-01-16 ENCOUNTER — Other Ambulatory Visit: Payer: Self-pay | Admitting: Family Medicine

## 2022-01-16 DIAGNOSIS — F341 Dysthymic disorder: Secondary | ICD-10-CM

## 2022-01-16 DIAGNOSIS — M7021 Olecranon bursitis, right elbow: Secondary | ICD-10-CM

## 2022-01-16 DIAGNOSIS — F419 Anxiety disorder, unspecified: Secondary | ICD-10-CM

## 2022-01-16 DIAGNOSIS — E7800 Pure hypercholesterolemia, unspecified: Secondary | ICD-10-CM

## 2022-01-25 ENCOUNTER — Telehealth (INDEPENDENT_AMBULATORY_CARE_PROVIDER_SITE_OTHER): Payer: No Typology Code available for payment source | Admitting: Family Medicine

## 2022-01-25 ENCOUNTER — Encounter: Payer: Self-pay | Admitting: Family Medicine

## 2022-01-25 VITALS — Ht 70.0 in

## 2022-01-25 DIAGNOSIS — E7800 Pure hypercholesterolemia, unspecified: Secondary | ICD-10-CM

## 2022-01-25 DIAGNOSIS — R635 Abnormal weight gain: Secondary | ICD-10-CM

## 2022-01-25 DIAGNOSIS — I10 Essential (primary) hypertension: Secondary | ICD-10-CM

## 2022-01-25 DIAGNOSIS — D649 Anemia, unspecified: Secondary | ICD-10-CM

## 2022-01-25 DIAGNOSIS — R7303 Prediabetes: Secondary | ICD-10-CM | POA: Diagnosis not present

## 2022-01-25 DIAGNOSIS — E291 Testicular hypofunction: Secondary | ICD-10-CM | POA: Diagnosis not present

## 2022-01-25 MED ORDER — METFORMIN HCL ER 500 MG PO TB24: ORAL_TABLET | ORAL | 1 refills | Status: DC

## 2022-01-25 MED ORDER — TESTOSTERONE CYPIONATE 200 MG/ML IM SOLN: 200.0000 mg | INTRAMUSCULAR | 1 refills | Status: DC

## 2022-01-25 MED ORDER — ROSUVASTATIN CALCIUM 20 MG PO TABS: 20.0000 mg | ORAL_TABLET | Freq: Every day | ORAL | 3 refills | Status: DC

## 2022-01-25 NOTE — Progress Notes (Addendum)
Established Patient Office Visit  Subjective:  Patient ID: Henry Hernandez, male    DOB: [birth year]  Age: 56 y.o. MRN: [id number]  CC:  Chief Complaint  Patient presents with   Advice Only    Discuss labs     HPI Henry Hernandez presents for a discussion of his recent lab work that showed elevation in his triglycerides, elevated hemoglobin A1c and a minor decrease in his hemoglobin level.  He has gained 20 pounds in the last several months.  He has been taking his Lipitor daily and was fasting blood draw.  He sciatica if rosuvastatin would be a better choice.  He has noted some shoulder pain and wonders if atorvastatin could be associated with it.  No explanation for hemoglobin.  Colonoscopy 5 years ago was normal.  No polyps  Past Medical History:  Diagnosis Date   Allergy    Chicken pox    Hyperlipidemia    Hypertension     Past Surgical History:  Procedure Laterality Date   BACK SURGERY      Family History  Problem Relation Age of Onset   Arthritis Mother    COPD Mother    Diabetes Mother    Heart attack Mother    Heart disease Mother    Hyperlipidemia Mother    Hypertension Mother    Kidney disease Mother    Stroke Mother    Hypertension Father    Hypertension Sister    Hypertension Brother    Cancer Maternal Grandmother    Hypertension Maternal Grandmother     Social History   Socioeconomic History   Marital status: Married    Spouse name: Not on file   Number of children: Not on file   Years of education: Not on file   Highest education level: Not on file  Occupational History   Not on file  Tobacco Use   Smoking status: Never   Smokeless tobacco: Never  Vaping Use   Vaping Use: Never used  Substance and Sexual Activity   Alcohol use: Not Currently   Drug use: Not Currently   Sexual activity: Not on file  Other Topics Concern   Not on file  Social History Narrative   Not on file   Social Determinants of Health   Financial Resource Strain:  Not on file  Food Insecurity: Not on file  Transportation Needs: Not on file  Physical Activity: Not on file  Stress: Not on file  Social Connections: Not on file  Intimate Partner Violence: Not on file    Outpatient Medications Prior to Visit  Medication Sig Dispense Refill   allopurinol (ZYLOPRIM) 300 MG tablet TAKE 1 TABLET BY MOUTH EVERY DAY 90 tablet 6   amLODipine (NORVASC) 10 MG tablet Take 1 tablet (10 mg total) by mouth daily. 90 tablet 1   aspirin EC 81 MG tablet Take 1 tablet by mouth daily.     cyanocobalamin (,VITAMIN B-12,) 1000 MCG/ML injection INJECT 1 ML (1,000 MCG TOTAL) INTO THE MUSCLE ONCE FOR 1 DOSE.     FLUoxetine (PROZAC) 10 MG capsule TAKE 2 CAPSULES BY MOUTH EVERY DAY 180 capsule 0   meloxicam (MOBIC) 15 MG tablet TAKE 1 TABLET BY MOUTH DAILY AS NEEDED WITH FOOD. 30 tablet 0   metoprolol succinate (TOPROL-XL) 25 MG 24 hr tablet TAKE 1 TABLET BY MOUTH EVERY DAY 90 tablet 2   olmesartan-hydrochlorothiazide (BENICAR HCT) 40-25 MG tablet TAKE 1 TABLET BY MOUTH EVERY DAY 90 tablet 2  atorvastatin (LIPITOR) 20 MG tablet TAKE 1 TABLET BY MOUTH EVERY DAY 90 tablet 2   No facility-administered medications prior to visit.    No Known Allergies  ROS Review of Systems  Constitutional:  Negative for diaphoresis, fatigue, fever and unexpected weight change.  HENT: Negative.    Eyes:  Negative for photophobia and visual disturbance.  Respiratory: Negative.    Cardiovascular: Negative.   Gastrointestinal:  Negative for abdominal pain, anal bleeding and blood in stool.  Genitourinary:  Negative for difficulty urinating and hematuria.  Musculoskeletal:  Positive for arthralgias.  Neurological:  Negative for speech difficulty and weakness.  Psychiatric/Behavioral: Negative.       Objective:    Physical Exam Vitals and nursing note reviewed.  Constitutional:      General: He is not in acute distress.    Appearance: Normal appearance. He is not ill-appearing,  toxic-appearing or diaphoretic.  Eyes:     General: No scleral icterus.       Right eye: No discharge.        Left eye: No discharge.     Extraocular Movements: Extraocular movements intact.     Conjunctiva/sclera: Conjunctivae normal.  Pulmonary:     Effort: Pulmonary effort is normal.  Neurological:     Mental Status: He is alert and oriented to person, place, and time.  Psychiatric:        Mood and Affect: Mood normal.        Behavior: Behavior normal.    Ht 5\' 10"  (1.778 m)    BMI 32.11 kg/m  Wt Readings from Last 3 Encounters:  01/08/22 223 lb 12.8 oz (101.5 kg)  10/02/21 220 lb 6.4 oz (100 kg)  08/30/21 219 lb 6.4 oz (99.5 kg)     There are no preventive care reminders to display for this patient.  There are no preventive care reminders to display for this patient.  Lab Results  Component Value Date   TSH 2.29 03/24/2020   Lab Results  Component Value Date   WBC 3.5 (L) 01/08/2022   HGB 12.7 (L) 01/08/2022   HCT 37.1 (L) 01/08/2022   MCV 86.9 01/08/2022   PLT 156.0 01/08/2022   Lab Results  Component Value Date   NA 140 01/08/2022   K 3.4 (L) 01/08/2022   CO2 28 01/08/2022   GLUCOSE 151 (H) 01/08/2022   BUN 24 (H) 01/08/2022   CREATININE 1.26 01/08/2022   BILITOT 0.7 01/08/2022   ALKPHOS 65 01/08/2022   AST 23 01/08/2022   ALT 48 01/08/2022   PROT 6.8 01/08/2022   ALBUMIN 4.4 01/08/2022   CALCIUM 9.2 01/08/2022   GFR 64.18 01/08/2022   Lab Results  Component Value Date   CHOL 162 01/08/2022   Lab Results  Component Value Date   HDL 31.80 (L) 01/08/2022   Lab Results  Component Value Date   LDLCALC 83 09/20/2020   Lab Results  Component Value Date   TRIG 296.0 (H) 01/08/2022   Lab Results  Component Value Date   CHOLHDL 5 01/08/2022   Lab Results  Component Value Date   HGBA1C 7.4 (H) 01/08/2022      Assessment & Plan:   Problem List Items Addressed This Visit       Endocrine   Androgen deficiency   Relevant Medications    testosterone cypionate (DEPOTESTOSTERONE CYPIONATE) 200 MG/ML injection     Other   Elevated cholesterol - Primary   Relevant Medications   rosuvastatin (CRESTOR) 20 MG tablet  Weight gain   Normocytic anemia   Prediabetes   Relevant Medications   metFORMIN (GLUCOPHAGE XR) 500 MG 24 hr tablet    Meds ordered this encounter  Medications   rosuvastatin (CRESTOR) 20 MG tablet    Sig: Take 1 tablet (20 mg total) by mouth daily.    Dispense:  90 tablet    Refill:  3   testosterone cypionate (DEPOTESTOSTERONE CYPIONATE) 200 MG/ML injection    Sig: Inject 1 mL (200 mg total) into the muscle every 14 (fourteen) days.    Dispense:  10 mL    Refill:  1   metFORMIN (GLUCOPHAGE XR) 500 MG 24 hr tablet    Sig: One nightly prior to bed.    Dispense:  90 tablet    Refill:  1    Follow-up: Return Has follow-up scheduled in 4 to 5 months..  Will change from atorvastatin to rosuvastatin for further lowering of LDL cholesterol.  We will start metformin XL in p.m.  Discussed side effects.  Patient will give testosterone supplementation a trial of 6 months make a decision then.  We will continue to follow hemoglobin.  He is going to increase his exercise.  He is going to make better food choices.  Virtual Visit via Video Note  I connected with Henry Hernandez on 01/25/22 at 10:30 AM EST by a video enabled telemedicine application and verified that I am speaking with the correct person using two identifiers.  Location: Patient: home alone.  Provider: work   I discussed the limitations of evaluation and management by telemedicine and the availability of in person appointments. The patient expressed understanding and agreed to proceed.  History of Present Illness:    Observations/Objective:   Assessment and Plan:   Follow Up Instructions:    I discussed the assessment and treatment plan with the patient. The patient was provided an opportunity to ask questions and all were answered.  The patient agreed with the plan and demonstrated an understanding of the instructions.   The patient was advised to call back or seek an in-person evaluation if the symptoms worsen or if the condition fails to improve as anticipated.  I provided 30 minutes of non-face-to-face time during this encounter.   Libby Maw, MD   Libby Maw, MD   3/6 addendum: Patient reports prepedal edema with increased dose of amlodipine.  Sens attached photographs demonstrating pedal edema.  Blood pressure remains elevated in the 150/90 range.  We will decrease amlodipine to 5 mg and add Aldactone 25 mg daily.  Continue olmesartan with HCTZ.  Has follow-up scheduled 1 April.

## 2022-02-23 ENCOUNTER — Other Ambulatory Visit: Payer: Self-pay | Admitting: Family Medicine

## 2022-02-23 DIAGNOSIS — M7021 Olecranon bursitis, right elbow: Secondary | ICD-10-CM

## 2022-03-01 ENCOUNTER — Encounter: Payer: Self-pay | Admitting: Family Medicine

## 2022-03-04 MED ORDER — AMLODIPINE BESYLATE 5 MG PO TABS: 5.0000 mg | ORAL_TABLET | Freq: Every day | ORAL | 0 refills | Status: DC

## 2022-03-04 MED ORDER — SPIRONOLACTONE 25 MG PO TABS: 25.0000 mg | ORAL_TABLET | Freq: Every day | ORAL | 0 refills | Status: DC

## 2022-03-04 NOTE — Addendum Note (Signed)
Addended by: Andrez Grime on: 03/04/2022 12:34 PM   Modules accepted: Orders

## 2022-03-31 ENCOUNTER — Other Ambulatory Visit: Payer: Self-pay | Admitting: Family Medicine

## 2022-03-31 DIAGNOSIS — M7021 Olecranon bursitis, right elbow: Secondary | ICD-10-CM

## 2022-04-08 ENCOUNTER — Ambulatory Visit: Payer: No Typology Code available for payment source | Admitting: Family Medicine

## 2022-04-08 ENCOUNTER — Encounter: Payer: Self-pay | Admitting: Family Medicine

## 2022-04-08 ENCOUNTER — Ambulatory Visit (INDEPENDENT_AMBULATORY_CARE_PROVIDER_SITE_OTHER): Payer: No Typology Code available for payment source | Admitting: Family Medicine

## 2022-04-08 VITALS — BP 148/90 | HR 74 | Temp 97.4°F | Ht 70.0 in | Wt 220.0 lb

## 2022-04-08 DIAGNOSIS — F341 Dysthymic disorder: Secondary | ICD-10-CM

## 2022-04-08 DIAGNOSIS — D649 Anemia, unspecified: Secondary | ICD-10-CM

## 2022-04-08 DIAGNOSIS — E538 Deficiency of other specified B group vitamins: Secondary | ICD-10-CM

## 2022-04-08 DIAGNOSIS — E1122 Type 2 diabetes mellitus with diabetic chronic kidney disease: Secondary | ICD-10-CM

## 2022-04-08 DIAGNOSIS — R7303 Prediabetes: Secondary | ICD-10-CM | POA: Diagnosis not present

## 2022-04-08 DIAGNOSIS — E782 Mixed hyperlipidemia: Secondary | ICD-10-CM

## 2022-04-08 DIAGNOSIS — F419 Anxiety disorder, unspecified: Secondary | ICD-10-CM

## 2022-04-08 DIAGNOSIS — M129 Arthropathy, unspecified: Secondary | ICD-10-CM

## 2022-04-08 DIAGNOSIS — I10 Essential (primary) hypertension: Secondary | ICD-10-CM | POA: Diagnosis not present

## 2022-04-08 DIAGNOSIS — E7800 Pure hypercholesterolemia, unspecified: Secondary | ICD-10-CM | POA: Diagnosis not present

## 2022-04-08 DIAGNOSIS — Z8739 Personal history of other diseases of the musculoskeletal system and connective tissue: Secondary | ICD-10-CM | POA: Diagnosis not present

## 2022-04-08 DIAGNOSIS — Z125 Encounter for screening for malignant neoplasm of prostate: Secondary | ICD-10-CM

## 2022-04-08 DIAGNOSIS — E559 Vitamin D deficiency, unspecified: Secondary | ICD-10-CM

## 2022-04-08 DIAGNOSIS — N1831 Chronic kidney disease, stage 3a: Secondary | ICD-10-CM

## 2022-04-08 LAB — CBC
HCT: 43.8 % (ref 39.0–52.0)
Hemoglobin: 14.9 g/dL (ref 13.0–17.0)
MCHC: 34.1 g/dL (ref 30.0–36.0)
MCV: 88.3 fl (ref 78.0–100.0)
Platelets: 169 10*3/uL (ref 150.0–400.0)
RBC: 4.96 Mil/uL (ref 4.22–5.81)
RDW: 13.4 % (ref 11.5–15.5)
WBC: 4.2 10*3/uL (ref 4.0–10.5)

## 2022-04-08 LAB — URINALYSIS, ROUTINE W REFLEX MICROSCOPIC
Bilirubin Urine: NEGATIVE
Hgb urine dipstick: NEGATIVE
Ketones, ur: NEGATIVE
Leukocytes,Ua: NEGATIVE
Nitrite: NEGATIVE
RBC / HPF: NONE SEEN (ref 0–?)
Specific Gravity, Urine: 1.02 (ref 1.000–1.030)
Total Protein, Urine: NEGATIVE
Urine Glucose: NEGATIVE
Urobilinogen, UA: 0.2 (ref 0.0–1.0)
WBC, UA: NONE SEEN (ref 0–?)
pH: 6 (ref 5.0–8.0)

## 2022-04-08 LAB — COMPREHENSIVE METABOLIC PANEL
ALT: 61 U/L — ABNORMAL HIGH (ref 0–53)
AST: 30 U/L (ref 0–37)
Albumin: 4.8 g/dL (ref 3.5–5.2)
Alkaline Phosphatase: 55 U/L (ref 39–117)
BUN: 21 mg/dL (ref 6–23)
CO2: 28 mEq/L (ref 19–32)
Calcium: 9.9 mg/dL (ref 8.4–10.5)
Chloride: 102 mEq/L (ref 96–112)
Creatinine, Ser: 1.36 mg/dL (ref 0.40–1.50)
GFR: 58.46 mL/min — ABNORMAL LOW (ref >60.00)
Glucose, Bld: 131 mg/dL — ABNORMAL HIGH (ref 70–99)
Potassium: 4.6 mEq/L (ref 3.5–5.1)
Sodium: 139 mEq/L (ref 135–145)
Total Bilirubin: 0.7 mg/dL (ref 0.2–1.2)
Total Protein: 7.2 g/dL (ref 6.0–8.3)

## 2022-04-08 LAB — VITAMIN D 25 HYDROXY (VIT D DEFICIENCY, FRACTURES): VITD: 28.05 ng/mL — ABNORMAL LOW (ref 30.00–100.00)

## 2022-04-08 LAB — LIPID PANEL
Cholesterol: 124 mg/dL (ref 0–200)
HDL: 31.9 mg/dL — ABNORMAL LOW (ref >39.00)
NonHDL: 91.7
Total CHOL/HDL Ratio: 4
Triglycerides: 279 mg/dL — ABNORMAL HIGH (ref 0.0–149.0)
VLDL: 55.8 mg/dL — ABNORMAL HIGH (ref 0.0–40.0)

## 2022-04-08 LAB — MICROALBUMIN / CREATININE URINE RATIO
Creatinine,U: 148 mg/dL
Microalb Creat Ratio: 1.5 mg/g (ref 0.0–30.0)
Microalb, Ur: 2.2 mg/dL — ABNORMAL HIGH (ref 0.0–1.9)

## 2022-04-08 LAB — URIC ACID: Uric Acid, Serum: 6.5 mg/dL (ref 4.0–7.8)

## 2022-04-08 LAB — VITAMIN B12: Vitamin B-12: 324 pg/mL (ref 211–911)

## 2022-04-08 LAB — PSA: PSA: 1.27 ng/mL (ref 0.10–4.00)

## 2022-04-08 LAB — HEMOGLOBIN A1C: Hgb A1c MFr Bld: 7.1 % — ABNORMAL HIGH (ref 4.6–6.5)

## 2022-04-08 LAB — LDL CHOLESTEROL, DIRECT: Direct LDL: 58 mg/dL

## 2022-04-08 MED ORDER — SPIRONOLACTONE 50 MG PO TABS: 50.0000 mg | ORAL_TABLET | Freq: Every day | ORAL | 2 refills | Status: DC

## 2022-04-08 MED ORDER — FLUOXETINE HCL 20 MG PO CAPS: 20.0000 mg | ORAL_CAPSULE | Freq: Every day | ORAL | 3 refills | Status: DC

## 2022-04-08 NOTE — Progress Notes (Addendum)
Established Patient Office Visit  Subjective:  Patient ID: Henry Hernandez, male    DOB: 08/11/1966  Age: 56 y.o. MRN: [id number]  CC:  Chief Complaint  Patient presents with   Follow-up    3 month follow up Pt is fasting.    HPI Henry Hernandez presents for follow-up of hypertension, dysthymia with anxiety, multiple arthralgias, prediabetes, and elevated cholesterol triglycerides.  Continues rosuvastatin without issue.  Currently taking amlodipine 5 mg, metoprolol 25 mg, olmesartan with HCTZ 40/25, and spironolactone 25 mg.  Did not tolerate the increase in amlodipine to 10 mg due to swelling.  Blood pressure still elevated.  He is feeling less stressed.  Prozac is helped elevate the mood.  He is sleeping better.  Continues to exercise by walking on his treadmill.  Personal life is settling out.  Has not decided to go part-time.  Taking meloxicam regularly for multiple joint aches and pains.  Testosterone injections seem to have decreased his energy levels.  Past Medical History:  Diagnosis Date   Allergy    Chicken pox    Hyperlipidemia    Hypertension     Past Surgical History:  Procedure Laterality Date   BACK SURGERY      Family History  Problem Relation Age of Onset   Arthritis Mother    COPD Mother    Diabetes Mother    Heart attack Mother    Heart disease Mother    Hyperlipidemia Mother    Hypertension Mother    Kidney disease Mother    Stroke Mother    Hypertension Father    Hypertension Sister    Hypertension Brother    Cancer Maternal Grandmother    Hypertension Maternal Grandmother     Social History   Socioeconomic History   Marital status: Married    Spouse name: Not on file   Number of children: Not on file   Years of education: Not on file   Highest education level: Not on file  Occupational History   Not on file  Tobacco Use   Smoking status: Never   Smokeless tobacco: Never  Vaping Use   Vaping Use: Never used  Substance and Sexual Activity    Alcohol use: Not Currently   Drug use: Not Currently   Sexual activity: Not on file  Other Topics Concern   Not on file  Social History Narrative   Not on file   Social Determinants of Health   Financial Resource Strain: Not on file  Food Insecurity: Not on file  Transportation Needs: Not on file  Physical Activity: Not on file  Stress: Not on file  Social Connections: Not on file  Intimate Partner Violence: Not on file    Outpatient Medications Prior to Visit  Medication Sig Dispense Refill   allopurinol (ZYLOPRIM) 300 MG tablet TAKE 1 TABLET BY MOUTH EVERY DAY 90 tablet 6   amLODipine (NORVASC) 5 MG tablet Take 1 tablet (5 mg total) by mouth daily. 90 tablet 0   aspirin EC 81 MG tablet Take 1 tablet by mouth daily.     cyanocobalamin (,VITAMIN B-12,) 1000 MCG/ML injection INJECT 1 ML (1,000 MCG TOTAL) INTO THE MUSCLE ONCE FOR 1 DOSE.     meloxicam (MOBIC) 15 MG tablet TAKE 1 TABLET BY MOUTH DAILY AS NEEDED WITH FOOD. 30 tablet 2   metFORMIN (GLUCOPHAGE XR) 500 MG 24 hr tablet One nightly prior to bed. 90 tablet 1   metoprolol succinate (TOPROL-XL) 25 MG 24 hr tablet  TAKE 1 TABLET BY MOUTH EVERY DAY 90 tablet 2   olmesartan-hydrochlorothiazide (BENICAR HCT) 40-25 MG tablet TAKE 1 TABLET BY MOUTH EVERY DAY 90 tablet 2   rosuvastatin (CRESTOR) 20 MG tablet Take 1 tablet (20 mg total) by mouth daily. 90 tablet 3   testosterone cypionate (DEPOTESTOSTERONE CYPIONATE) 200 MG/ML injection Inject 1 mL (200 mg total) into the muscle every 14 (fourteen) days. 10 mL 1   FLUoxetine (PROZAC) 10 MG capsule TAKE 2 CAPSULES BY MOUTH EVERY DAY 180 capsule 0   spironolactone (ALDACTONE) 25 MG tablet Take 1 tablet (25 mg total) by mouth daily. 90 tablet 0   No facility-administered medications prior to visit.    No Known Allergies  ROS Review of Systems  Constitutional:  Negative for chills, diaphoresis, fatigue, fever and unexpected weight change.  HENT: Negative.    Eyes:  Negative for  photophobia and visual disturbance.  Respiratory: Negative.    Cardiovascular: Negative.   Gastrointestinal: Negative.   Endocrine: Negative for polyphagia and polyuria.  Genitourinary:  Negative for difficulty urinating, frequency, hematuria and urgency.  Musculoskeletal:  Positive for arthralgias.  Skin:  Negative for pallor and rash.  Neurological:  Negative for speech difficulty, weakness and light-headedness.     Objective:    Physical Exam Vitals and nursing note reviewed.  Constitutional:      General: He is not in acute distress.    Appearance: Normal appearance. He is not ill-appearing, toxic-appearing or diaphoretic.  HENT:     Head: Normocephalic and atraumatic.     Right Ear: Tympanic membrane, ear canal and external ear normal.     Left Ear: Tympanic membrane, ear canal and external ear normal.     Mouth/Throat:     Mouth: Mucous membranes are moist.     Pharynx: Oropharynx is clear. No oropharyngeal exudate or posterior oropharyngeal erythema.  Eyes:     Extraocular Movements: Extraocular movements intact.     Conjunctiva/sclera: Conjunctivae normal.     Pupils: Pupils are equal, round, and reactive to light.  Cardiovascular:     Rate and Rhythm: Normal rate and regular rhythm.  Pulmonary:     Effort: Pulmonary effort is normal.     Breath sounds: Normal breath sounds.  Abdominal:     General: Bowel sounds are normal.  Musculoskeletal:     Cervical back: No rigidity or tenderness.  Lymphadenopathy:     Cervical: No cervical adenopathy.  Skin:    General: Skin is warm and dry.  Neurological:     Mental Status: He is alert and oriented to person, place, and time.  Psychiatric:        Mood and Affect: Mood normal.        Behavior: Behavior normal.    BP (!) 148/90 (BP Location: Left Arm, Patient Position: Sitting, Cuff Size: Normal)   Pulse 74   Temp (!) 97.4 F (36.3 C) (Temporal)   Ht 5\' 10"  (1.778 m)   Wt 220 lb (99.8 kg)   SpO2 93%   BMI 31.57  kg/m  Wt Readings from Last 3 Encounters:  04/08/22 220 lb (99.8 kg)  01/08/22 223 lb 12.8 oz (101.5 kg)  10/02/21 220 lb 6.4 oz (100 kg)     Health Maintenance Due  Topic Date Due   FOOT EXAM  Never done   OPHTHALMOLOGY EXAM  Never done    There are no preventive care reminders to display for this patient.  Lab Results  Component Value Date  TSH 2.29 03/24/2020   Lab Results  Component Value Date   WBC 4.2 04/08/2022   HGB 14.9 04/08/2022   HCT 43.8 04/08/2022   MCV 88.3 04/08/2022   PLT 169.0 04/08/2022   Lab Results  Component Value Date   NA 139 04/08/2022   K 4.6 04/08/2022   CO2 28 04/08/2022   GLUCOSE 131 (H) 04/08/2022   BUN 21 04/08/2022   CREATININE 1.36 04/08/2022   BILITOT 0.7 04/08/2022   ALKPHOS 55 04/08/2022   AST 30 04/08/2022   ALT 61 (H) 04/08/2022   PROT 7.2 04/08/2022   ALBUMIN 4.8 04/08/2022   CALCIUM 9.9 04/08/2022   GFR 58.46 (L) 04/08/2022   Lab Results  Component Value Date   CHOL 124 04/08/2022   Lab Results  Component Value Date   HDL 31.90 (L) 04/08/2022   Lab Results  Component Value Date   LDLCALC 83 09/20/2020   Lab Results  Component Value Date   TRIG 279.0 (H) 04/08/2022   Lab Results  Component Value Date   CHOLHDL 4 04/08/2022   Lab Results  Component Value Date   HGBA1C 7.1 (H) 04/08/2022      Assessment & Plan:   Problem List Items Addressed This Visit       Cardiovascular and Mediastinum   Essential hypertension   Relevant Medications   spironolactone (ALDACTONE) 50 MG tablet   Other Relevant Orders   CBC (Completed)   Comprehensive metabolic panel (Completed)     Endocrine   Type 2 diabetes mellitus with stage 3a chronic kidney disease, without long-term current use of insulin (HCC)   Relevant Medications   empagliflozin (JARDIANCE) 10 MG TABS tablet     Musculoskeletal and Integument   Arthritis involving multiple sites     Other   Elevated cholesterol - Primary   Relevant  Medications   spironolactone (ALDACTONE) 50 MG tablet   Other Relevant Orders   Comprehensive metabolic panel (Completed)   LDL cholesterol, direct (Completed)   Lipid panel (Completed)   Vitamin D deficiency   Relevant Medications   Vitamin D, Ergocalciferol, (DRISDOL) 1.25 MG (50000 UNIT) CAPS capsule   Other Relevant Orders   VITAMIN D 25 Hydroxy (Vit-D Deficiency, Fractures) (Completed)   Screening for prostate cancer   Relevant Orders   PSA (Completed)   Normocytic anemia   Relevant Orders   CBC (Completed)   B12 deficiency   Relevant Orders   Vitamin B12 (Completed)   Anxiety   Relevant Medications   FLUoxetine (PROZAC) 20 MG capsule   Dysthymia   Relevant Medications   FLUoxetine (PROZAC) 20 MG capsule   History of gout   Relevant Orders   Uric acid (Completed)   Prediabetes   Relevant Orders   Comprehensive metabolic panel (Completed)   Hemoglobin A1c (Completed)   Urinalysis, Routine w reflex microscopic (Completed)   Microalbumin / creatinine urine ratio (Completed)   Other Visit Diagnoses     Elevated triglycerides with high cholesterol       Relevant Medications   spironolactone (ALDACTONE) 50 MG tablet   Omega-3 Fatty Acids (FISH OIL BURP-LESS) 1000 MG CAPS       Meds ordered this encounter  Medications   spironolactone (ALDACTONE) 50 MG tablet    Sig: Take 1 tablet (50 mg total) by mouth daily.    Dispense:  90 tablet    Refill:  2   FLUoxetine (PROZAC) 20 MG capsule    Sig: Take 1 capsule (20 mg total)  by mouth daily.    Dispense:  90 capsule    Refill:  3   Vitamin D, Ergocalciferol, (DRISDOL) 1.25 MG (50000 UNIT) CAPS capsule    Sig: Take 1 capsule (50,000 Units total) by mouth every 7 (seven) days.    Dispense:  15 capsule    Refill:  1   empagliflozin (JARDIANCE) 10 MG TABS tablet    Sig: Take 1 tablet (10 mg total) by mouth daily before breakfast.    Dispense:  30 tablet    Refill:  5   Omega-3 Fatty Acids (FISH OIL BURP-LESS) 1000  MG CAPS    Sig: Take 2,000 mg by mouth daily.    Dispense:  180 capsule    Refill:  2    Follow-up: Return in about 3 months (around 07/08/2022).  Have increased Aldactone to 50 mg.  Continue fluoxetine at 20 mg daily.  Patient will continue weight loss efforts with regular exercise on his treadmill.  Continue all other medications. Discussed adding GLP1 agonist.   Mliss Sax, MD  4/11 addendum: Have changed prediabetes diagnosis to type II with renal impairment.  Have added Jardiance.  Have added fish oil for persisting high triglycerides with an elevated ALT.  4/11 addendum: Patient requests GLP! Agonist in lieu of SGLT2 med. Wants to avoid additional diuretic effect.

## 2022-04-09 ENCOUNTER — Encounter: Payer: Self-pay | Admitting: Family Medicine

## 2022-04-09 ENCOUNTER — Other Ambulatory Visit: Payer: Self-pay | Admitting: Family Medicine

## 2022-04-09 DIAGNOSIS — E1122 Type 2 diabetes mellitus with diabetic chronic kidney disease: Secondary | ICD-10-CM | POA: Insufficient documentation

## 2022-04-09 DIAGNOSIS — M129 Arthropathy, unspecified: Secondary | ICD-10-CM | POA: Insufficient documentation

## 2022-04-09 MED ORDER — SEMAGLUTIDE(0.25 OR 0.5MG/DOS) 2 MG/1.5ML ~~LOC~~ SOPN: 0.5000 mg | PEN_INJECTOR | SUBCUTANEOUS | 2 refills | Status: DC

## 2022-04-09 MED ORDER — EMPAGLIFLOZIN 10 MG PO TABS: 10.0000 mg | ORAL_TABLET | Freq: Every day | ORAL | 5 refills | Status: DC

## 2022-04-09 MED ORDER — FISH OIL BURP-LESS 1000 MG PO CAPS: 2000.0000 mg | ORAL_CAPSULE | Freq: Every day | ORAL | 2 refills | Status: AC

## 2022-04-09 MED ORDER — VITAMIN D (ERGOCALCIFEROL) 1.25 MG (50000 UNIT) PO CAPS: 50000.0000 [IU] | ORAL_CAPSULE | ORAL | 1 refills | Status: DC

## 2022-04-09 NOTE — Addendum Note (Signed)
Addended by: Andrez Grime on: 04/09/2022 01:21 PM ? ? Modules accepted: Orders ? ?

## 2022-04-09 NOTE — Addendum Note (Signed)
Addended by: Andrez Grime on: 04/09/2022 07:55 AM ? ? Modules accepted: Orders ? ?

## 2022-05-30 ENCOUNTER — Other Ambulatory Visit: Payer: Self-pay | Admitting: Family Medicine

## 2022-05-30 DIAGNOSIS — I10 Essential (primary) hypertension: Secondary | ICD-10-CM

## 2022-07-05 ENCOUNTER — Other Ambulatory Visit: Payer: Self-pay | Admitting: Family Medicine

## 2022-07-05 DIAGNOSIS — R7303 Prediabetes: Secondary | ICD-10-CM

## 2022-07-05 DIAGNOSIS — I10 Essential (primary) hypertension: Secondary | ICD-10-CM

## 2022-07-09 ENCOUNTER — Ambulatory Visit (INDEPENDENT_AMBULATORY_CARE_PROVIDER_SITE_OTHER): Payer: No Typology Code available for payment source | Admitting: Family Medicine

## 2022-07-09 ENCOUNTER — Encounter: Payer: Self-pay | Admitting: Family Medicine

## 2022-07-09 VITALS — BP 124/74 | HR 76 | Temp 97.5°F | Ht 70.0 in | Wt 204.6 lb

## 2022-07-09 DIAGNOSIS — N1831 Chronic kidney disease, stage 3a: Secondary | ICD-10-CM | POA: Diagnosis not present

## 2022-07-09 DIAGNOSIS — E538 Deficiency of other specified B group vitamins: Secondary | ICD-10-CM | POA: Diagnosis not present

## 2022-07-09 DIAGNOSIS — Z8739 Personal history of other diseases of the musculoskeletal system and connective tissue: Secondary | ICD-10-CM

## 2022-07-09 DIAGNOSIS — F341 Dysthymic disorder: Secondary | ICD-10-CM | POA: Diagnosis not present

## 2022-07-09 DIAGNOSIS — E1122 Type 2 diabetes mellitus with diabetic chronic kidney disease: Secondary | ICD-10-CM | POA: Diagnosis not present

## 2022-07-09 DIAGNOSIS — I10 Essential (primary) hypertension: Secondary | ICD-10-CM

## 2022-07-09 LAB — BASIC METABOLIC PANEL
BUN: 50 mg/dL — ABNORMAL HIGH (ref 6–23)
CO2: 23 mEq/L (ref 19–32)
Calcium: 10.3 mg/dL (ref 8.4–10.5)
Chloride: 108 mEq/L (ref 96–112)
Creatinine, Ser: 2.18 mg/dL — ABNORMAL HIGH (ref 0.40–1.50)
GFR: 33.13 mL/min — ABNORMAL LOW (ref >60.00)
Glucose, Bld: 104 mg/dL — ABNORMAL HIGH (ref 70–99)
Potassium: 5.9 mEq/L — ABNORMAL HIGH (ref 3.5–5.1)
Sodium: 140 mEq/L (ref 135–145)

## 2022-07-09 LAB — VITAMIN B12: Vitamin B-12: 264 pg/mL (ref 211–911)

## 2022-07-09 LAB — HEMOGLOBIN A1C: Hgb A1c MFr Bld: 7.4 % — ABNORMAL HIGH (ref 4.6–6.5)

## 2022-07-09 NOTE — Progress Notes (Signed)
Established Patient Office Visit  Subjective   Patient ID: Henry Hernandez, male    DOB: [birth year]  Age: 56 y.o. MRN: [id number]  Chief Complaint  Patient presents with   Follow-up    3 month follow up, no concerns.     HPI follow-up of diabetes with status post initiation of treatment with semaglutide, hypertension, B12 deficiency and dysthymia.  Fluoxetine continues to help.  As a whole lot less stressful for him.  Very caregiver several relatives have since passed.  Continues with B12 pends.  Has lost 16 pounds and blood pressure is decreased significantly.  He has been lightheaded with change in head position.  Blood pressure is much lower now.    Review of Systems  Constitutional: Negative.   HENT: Negative.    Eyes:  Negative for blurred vision, discharge and redness.  Respiratory: Negative.    Cardiovascular: Negative.   Gastrointestinal:  Negative for abdominal pain.  Genitourinary: Negative.   Musculoskeletal: Negative.  Negative for myalgias.  Skin:  Negative for rash.  Neurological:  Negative for dizziness, tingling, loss of consciousness, weakness and headaches.  Endo/Heme/Allergies:  Negative for polydipsia.      Objective:     BP 124/74 (BP Location: Right Arm, Patient Position: Sitting, Cuff Size: Normal)   Pulse 76   Temp (!) 97.5 F (36.4 C) (Temporal)   Ht 5\' 10"  (1.778 m)   Wt 204 lb 9.6 oz (92.8 kg)   SpO2 94%   BMI 29.36 kg/m  BP Readings from Last 3 Encounters:  07/09/22 124/74  04/08/22 (!) 148/90  01/08/22 140/80   Wt Readings from Last 3 Encounters:  07/09/22 204 lb 9.6 oz (92.8 kg)  04/08/22 220 lb (99.8 kg)  01/08/22 223 lb 12.8 oz (101.5 kg)      Physical Exam Constitutional:      General: He is not in acute distress.    Appearance: Normal appearance. He is not ill-appearing, toxic-appearing or diaphoretic.  HENT:     Head: Normocephalic and atraumatic.     Right Ear: External ear normal.     Left Ear: External ear normal.      Mouth/Throat:     Mouth: Mucous membranes are moist.     Pharynx: Oropharynx is clear. No oropharyngeal exudate or posterior oropharyngeal erythema.  Eyes:     General: No scleral icterus.       Right eye: No discharge.        Left eye: No discharge.     Extraocular Movements: Extraocular movements intact.     Conjunctiva/sclera: Conjunctivae normal.     Pupils: Pupils are equal, round, and reactive to light.  Cardiovascular:     Rate and Rhythm: Normal rate and regular rhythm.  Pulmonary:     Effort: Pulmonary effort is normal. No respiratory distress.     Breath sounds: Normal breath sounds.  Musculoskeletal:     Cervical back: No rigidity or tenderness.  Skin:    General: Skin is warm and dry.  Neurological:     Mental Status: He is alert and oriented to person, place, and time.  Psychiatric:        Mood and Affect: Mood normal.        Behavior: Behavior normal.      Results for orders placed or performed in visit on 07/09/22  Basic metabolic panel  Result Value Ref Range   Sodium 140 135 - 145 mEq/L   Potassium 5.9 (H) 3.5 - 5.1 mEq/L  Chloride 108 96 - 112 mEq/L   CO2 23 19 - 32 mEq/L   Glucose, Bld 104 (H) 70 - 99 mg/dL   BUN 50 (H) 6 - 23 mg/dL   Creatinine, Ser 7.00 (H) 0.40 - 1.50 mg/dL   GFR 17.49 (L) >44.96 mL/min   Calcium 10.3 8.4 - 10.5 mg/dL  Vitamin P59  Result Value Ref Range   Vitamin B-12 264 211 - 911 pg/mL  Hemoglobin A1c  Result Value Ref Range   Hgb A1c MFr Bld 7.4 (H) 4.6 - 6.5 %      The ASCVD Risk score (Arnett DK, et al., 2019) failed to calculate for the following reasons:   The valid total cholesterol range is 130 to 320 mg/dL    Assessment & Plan:   Problem List Items Addressed This Visit       Cardiovascular and Mediastinum   Essential hypertension   Relevant Orders   Basic metabolic panel (Completed)     Endocrine   Type 2 diabetes mellitus with stage 3a chronic kidney disease, without long-term current use of insulin  (HCC) - Primary   Relevant Orders   Basic metabolic panel (Completed)   Hemoglobin A1c (Completed)     Other   B12 deficiency   Relevant Orders   Vitamin B12 (Completed)   Dysthymia   History of gout   Relevant Medications   allopurinol (ZYLOPRIM) 100 MG tablet    Return in about 6 months (around 01/09/2023).  Stop Aldactone.  Continue semaglutide at the 0.25 weekly dose which is more tolerable for him.  Continue weight loss efforts.  Continue B12 injections.  Continue fluoxetine.  Briefly discussed discontinuation 12 months after symptoms have resolved.  There is a lot less stress in his life now.  Mliss Sax, MD

## 2022-07-10 MED ORDER — ALLOPURINOL 100 MG PO TABS: 150.0000 mg | ORAL_TABLET | Freq: Every day | ORAL | 1 refills | Status: DC

## 2022-07-10 NOTE — Addendum Note (Signed)
Addended by: Nadene Rubins A on: 07/10/2022 11:02 AM   Modules accepted: Orders

## 2022-07-11 ENCOUNTER — Telehealth: Payer: Self-pay | Admitting: Family Medicine

## 2022-07-11 NOTE — Telephone Encounter (Signed)
Per patient message below sent via Mychart and patient will respond back with any questions.

## 2022-07-11 NOTE — Telephone Encounter (Signed)
Returned patients call no answer, unable to LM will call back.

## 2022-07-11 NOTE — Telephone Encounter (Signed)
Spoke with patient went over labs and recommendations per patient ge will hold on meloxicam and pick up new Rx of Allopurinol. Patient has concerns about Ozempic possibly causing kidney function to drop would like to know if should stop taking this and if this drop in function could be reversed? Please advise

## 2022-07-11 NOTE — Telephone Encounter (Signed)
Pt was returning missed call from [facility]. Was asking if drop in kidney function was reversible and had a question about one of his medications.

## 2022-07-11 NOTE — Telephone Encounter (Signed)
Pt has gotten instruction through his mychart concerning his lab results. He is wanting a call back to go over this. Pt's # [phone]

## 2022-07-18 IMAGING — DX DG FOOT COMPLETE 3+V*L*
3 series · 3 of 3 positions shown · non-contrast
Comparison: None.

CLINICAL DATA: Pain and swelling

EXAM:
LEFT FOOT - COMPLETE 3+ VIEW

[foot ap]
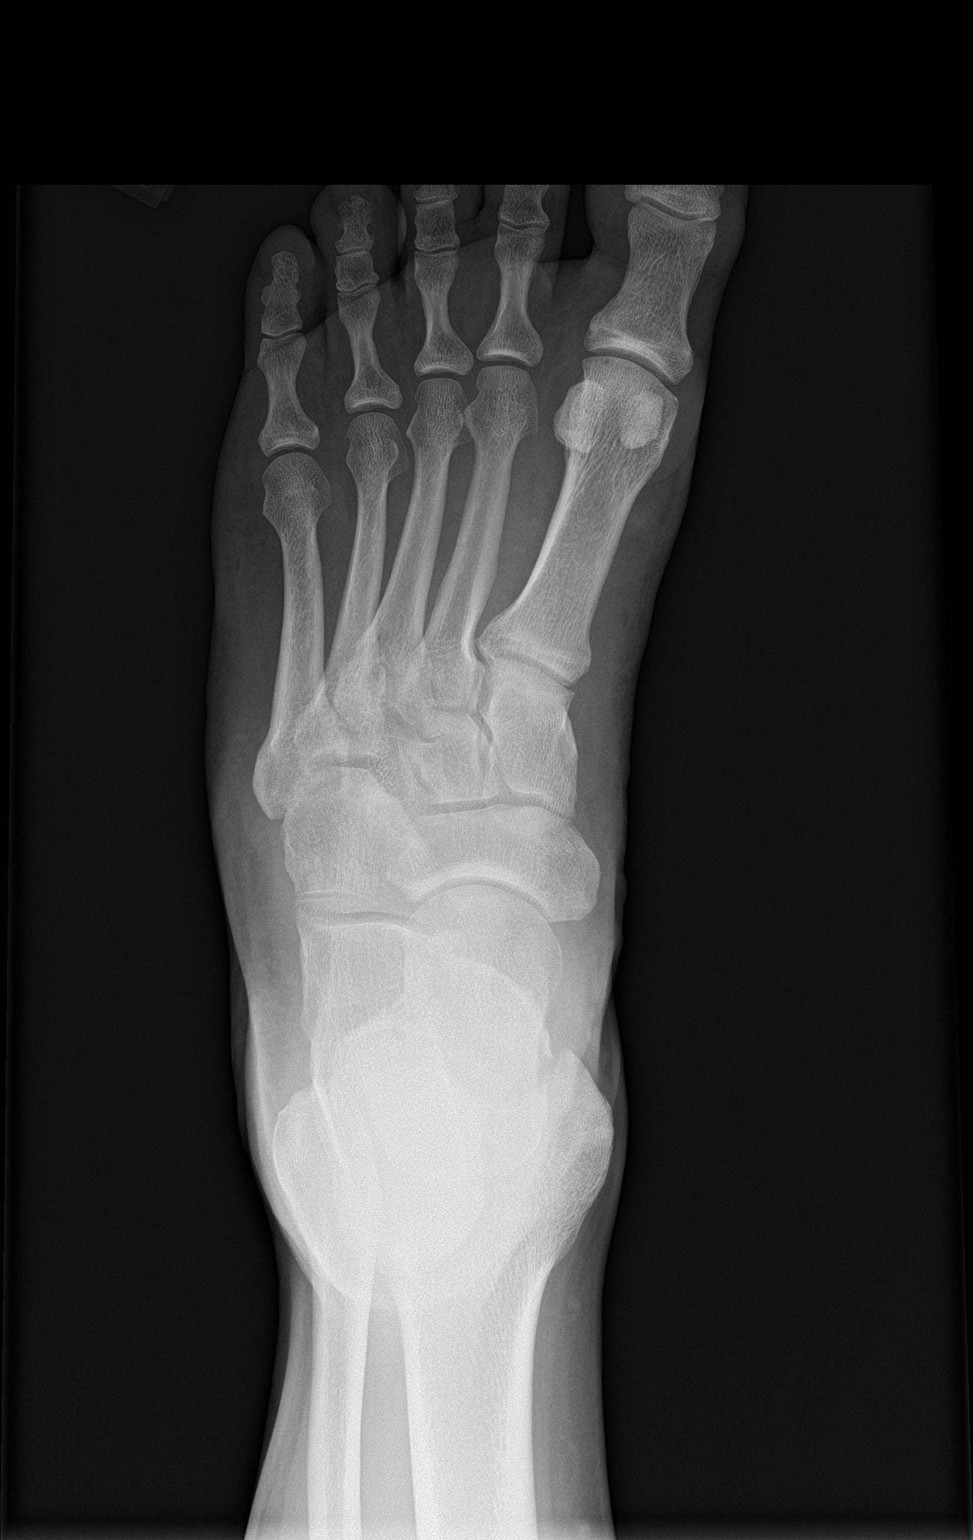

[foot obl]
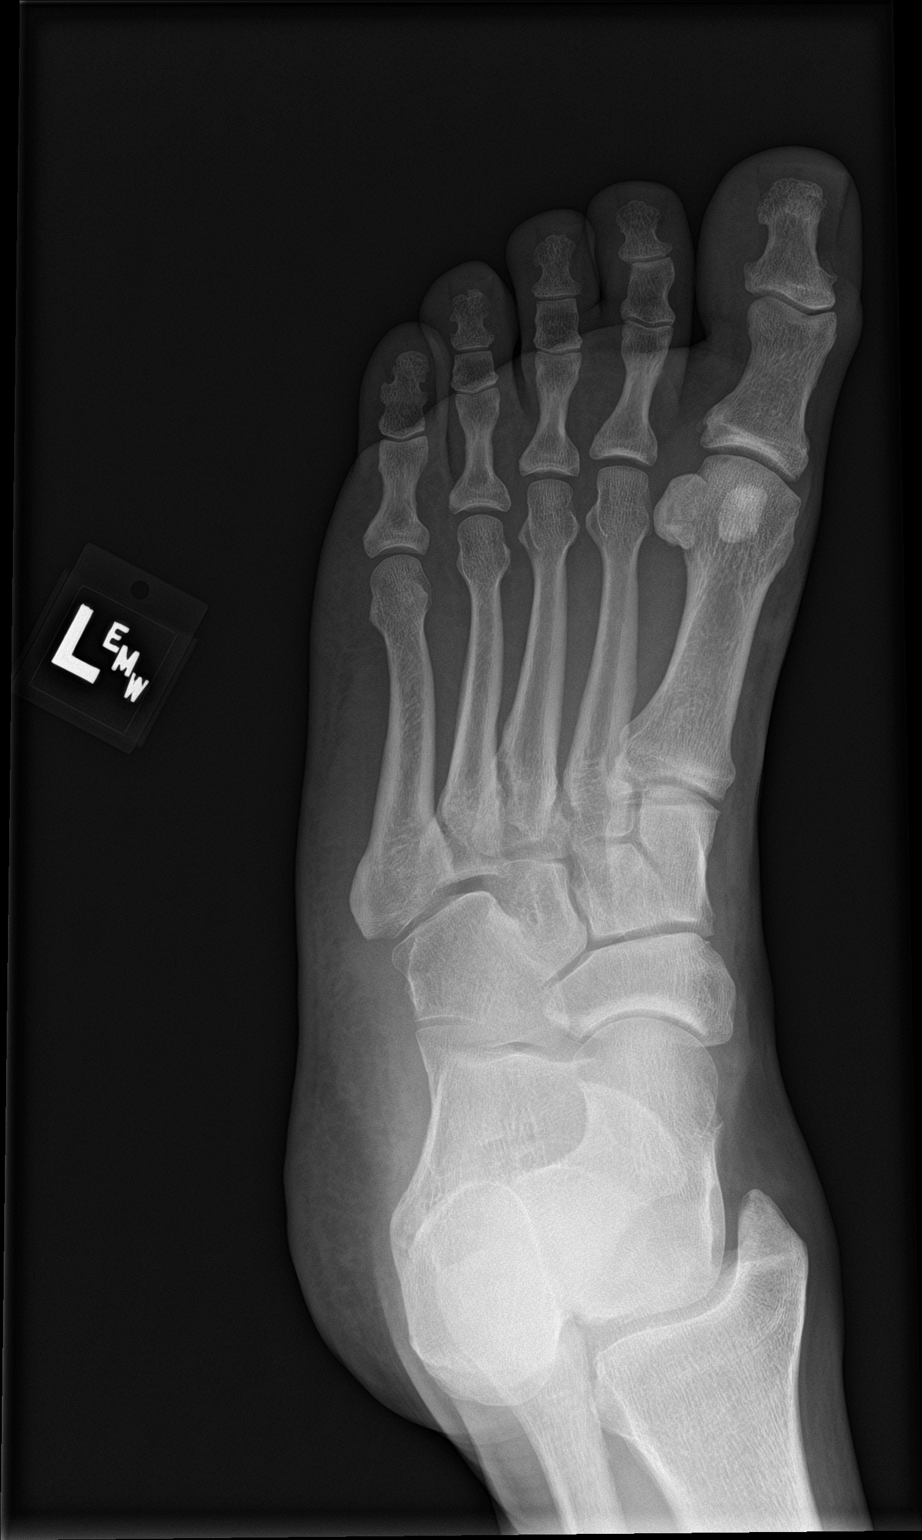

[foot lat]
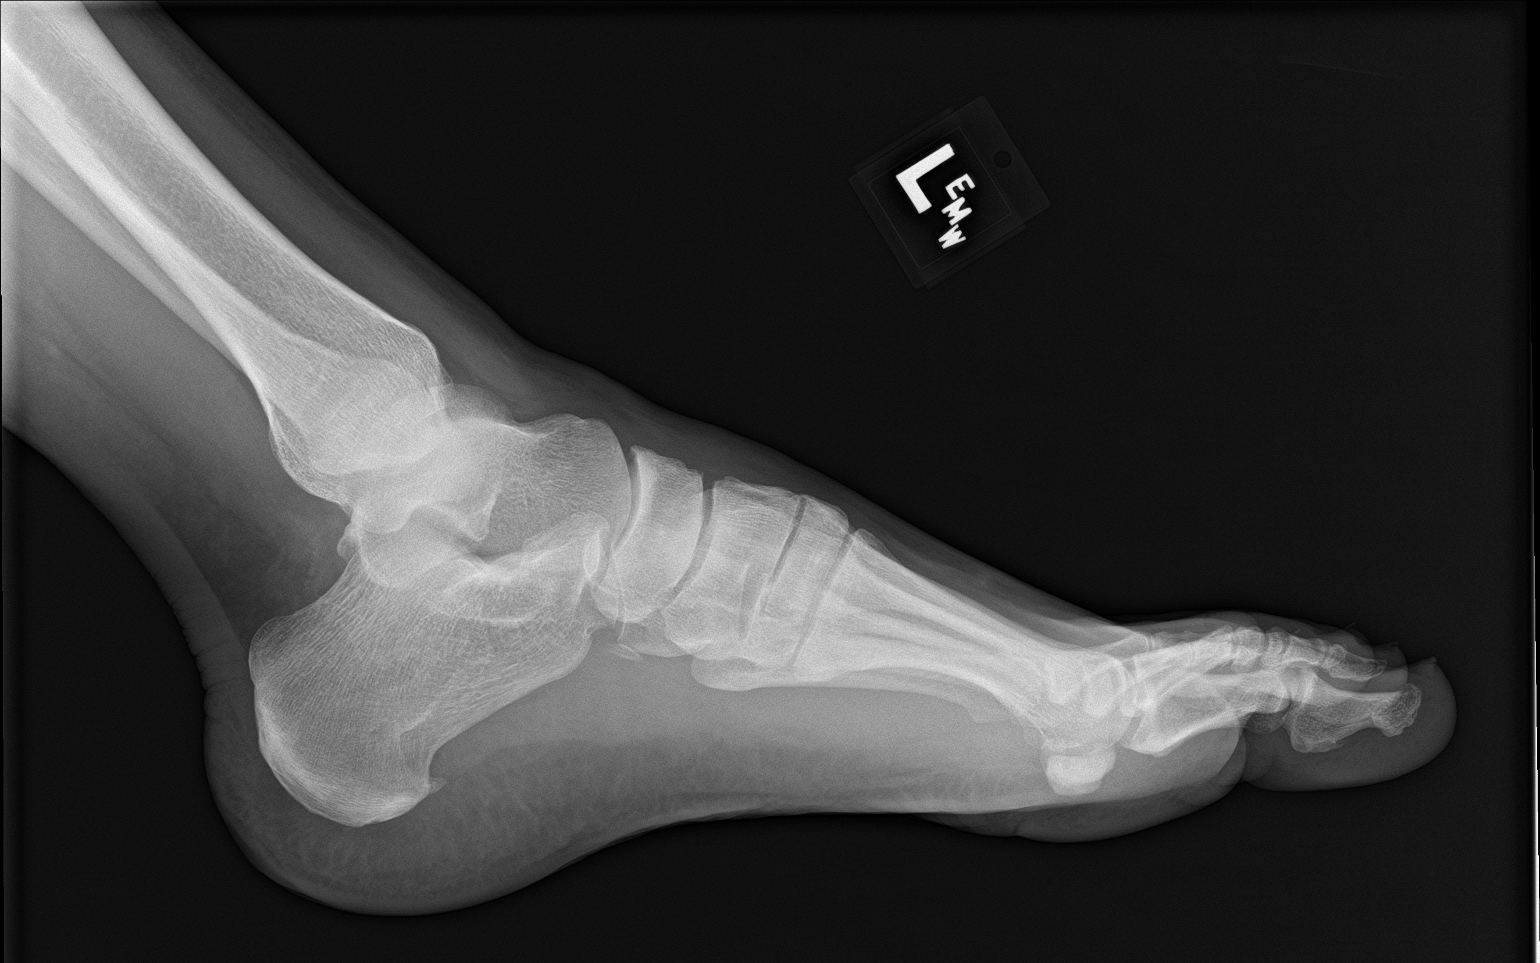

[3 of 3 positions shown; findings below may reference images not displayed]

FINDINGS: Minimally displaced inferior cuboid fracture. Otherwise normal foot
alignment with approximation of the joints. There is no evidence of
arthropathy or focal bone lesion. Mild soft tissue swelling.
IMPRESSION: Minimally displaced inferior cuboid fracture.

Mild soft tissue swelling.

## 2022-07-19 ENCOUNTER — Other Ambulatory Visit: Payer: Self-pay | Admitting: Family Medicine

## 2022-07-19 DIAGNOSIS — I10 Essential (primary) hypertension: Secondary | ICD-10-CM

## 2022-07-30 ENCOUNTER — Other Ambulatory Visit: Payer: Self-pay | Admitting: Family Medicine

## 2022-07-30 DIAGNOSIS — E291 Testicular hypofunction: Secondary | ICD-10-CM

## 2022-08-01 ENCOUNTER — Ambulatory Visit (INDEPENDENT_AMBULATORY_CARE_PROVIDER_SITE_OTHER): Payer: No Typology Code available for payment source | Admitting: Family Medicine

## 2022-08-01 ENCOUNTER — Encounter: Payer: Self-pay | Admitting: Family Medicine

## 2022-08-01 VITALS — BP 124/68 | HR 78 | Temp 98.0°F | Ht 70.0 in | Wt 211.2 lb

## 2022-08-01 DIAGNOSIS — R944 Abnormal results of kidney function studies: Secondary | ICD-10-CM | POA: Diagnosis not present

## 2022-08-01 DIAGNOSIS — M109 Gout, unspecified: Secondary | ICD-10-CM | POA: Diagnosis not present

## 2022-08-01 DIAGNOSIS — T887XXA Unspecified adverse effect of drug or medicament, initial encounter: Secondary | ICD-10-CM

## 2022-08-01 NOTE — Progress Notes (Addendum)
Established Patient Office Visit  Subjective   Patient ID: Henry Hernandez, male    DOB: [birth year]  Age: 56 y.o. MRN: [id number]  Chief Complaint  Patient presents with   Foot Injury    Right foot pain little tingling symptoms x 2 days. Right side of lower back dull achy feeling     Foot Injury  Pertinent negatives include no tingling.   follow-up for sudden drop in GFR.  He discontinued semaglutide immediately.  Admits that he was having a difficult time tolerating the semaglutide with ongoing nausea and indigestion.  Aldactone has been discontinued.  Blood pressure has remained normal.  He has held meloxicam.  Has a 2-day history of swelling, warmth and pain across his anterior ankle and dorsum of the foot.  There is been no injury.  Continues with allopurinol.  Denies recent high purine diet.    Review of Systems  Constitutional: Negative.   HENT: Negative.    Eyes:  Negative for blurred vision, discharge and redness.  Respiratory: Negative.    Cardiovascular: Negative.   Gastrointestinal:  Negative for abdominal pain.  Genitourinary:  Negative for flank pain, frequency, hematuria and urgency.  Musculoskeletal:  Positive for joint pain. Negative for myalgias.  Skin:  Negative for rash.  Neurological:  Negative for tingling, loss of consciousness and weakness.  Endo/Heme/Allergies:  Negative for polydipsia.      Objective:     BP 124/68 (BP Location: Right Arm, Patient Position: Sitting, Cuff Size: Normal)   Pulse 78   Temp 98 F (36.7 C) (Temporal)   Ht 5\' 10"  (1.778 m)   Wt 211 lb 3.2 oz (95.8 kg)   SpO2 98%   BMI 30.30 kg/m    Physical Exam Constitutional:      General: He is not in acute distress.    Appearance: Normal appearance. He is not ill-appearing, toxic-appearing or diaphoretic.  HENT:     Head: Normocephalic and atraumatic.     Right Ear: External ear normal.     Left Ear: External ear normal.  Eyes:     General: No scleral icterus.       Right  eye: No discharge.        Left eye: No discharge.     Extraocular Movements: Extraocular movements intact.     Conjunctiva/sclera: Conjunctivae normal.  Pulmonary:     Effort: Pulmonary effort is normal. No respiratory distress.  Musculoskeletal:     Cervical back: No tenderness.     Right ankle: Swelling present. Tenderness present. Normal pulse.       Legs:  Skin:    General: Skin is warm and dry.  Neurological:     Mental Status: He is alert and oriented to person, place, and time.  Psychiatric:        Mood and Affect: Mood normal.        Behavior: Behavior normal.      Results for orders placed or performed in visit on 08/01/22  Basic metabolic panel  Result Value Ref Range   Sodium 142 135 - 145 mEq/L   Potassium 4.3 3.5 - 5.1 mEq/L   Chloride 105 96 - 112 mEq/L   CO2 30 19 - 32 mEq/L   Glucose, Bld 139 (H) 70 - 99 mg/dL   BUN 23 6 - 23 mg/dL   Creatinine, Ser 10/01/22 (H) 0.40 - 1.50 mg/dL   GFR 9.35 (L) 70.17 mL/min   Calcium 9.6 8.4 - 10.5 mg/dL  The ASCVD Risk score (Arnett DK, et al., 2019) failed to calculate for the following reasons:   The valid total cholesterol range is 130 to 320 mg/dL    Assessment & Plan:   Problem List Items Addressed This Visit       Musculoskeletal and Integument   Acute gout of right ankle     Other   Decreased GFR - Primary   Relevant Orders   Basic metabolic panel (Completed)   Medication side effect    Return in 6 weeks (on 09/12/2022), or if symptoms worsen or fail to improve.   We will use colchicine 0.6 twice daily for 5 to 7 days and follow-up if not resolving.  Hopefully GFR has improved with discontinuation of semaglutide and Aldactone. Mliss Sax, MD

## 2022-08-02 ENCOUNTER — Encounter: Payer: Self-pay | Admitting: Family Medicine

## 2022-08-02 DIAGNOSIS — M109 Gout, unspecified: Secondary | ICD-10-CM

## 2022-08-02 LAB — BASIC METABOLIC PANEL
BUN: 23 mg/dL (ref 6–23)
CO2: 30 mEq/L (ref 19–32)
Calcium: 9.6 mg/dL (ref 8.4–10.5)
Chloride: 105 mEq/L (ref 96–112)
Creatinine, Ser: 1.85 mg/dL — ABNORMAL HIGH (ref 0.40–1.50)
GFR: 40.32 mL/min — ABNORMAL LOW (ref >60.00)
Glucose, Bld: 139 mg/dL — ABNORMAL HIGH (ref 70–99)
Potassium: 4.3 mEq/L (ref 3.5–5.1)
Sodium: 142 mEq/L (ref 135–145)

## 2022-08-02 MED ORDER — COLCHICINE 0.6 MG PO TABS: 0.6000 mg | ORAL_TABLET | Freq: Two times a day (BID) | ORAL | 3 refills | Status: AC

## 2022-09-12 ENCOUNTER — Telehealth: Payer: Self-pay

## 2022-09-12 NOTE — Telephone Encounter (Signed)
PA for [facility] submitted through cover my meds.  Awaiting response.  Dm/cma   Key: Cleon Gustin

## 2022-10-04 ENCOUNTER — Other Ambulatory Visit: Payer: Self-pay | Admitting: Family Medicine

## 2022-10-04 DIAGNOSIS — I10 Essential (primary) hypertension: Secondary | ICD-10-CM

## 2022-11-27 ENCOUNTER — Other Ambulatory Visit: Payer: Self-pay | Admitting: Family Medicine

## 2022-11-27 DIAGNOSIS — E559 Vitamin D deficiency, unspecified: Secondary | ICD-10-CM

## 2022-12-30 HISTORY — PX: COLONOSCOPY: SHX174

## 2023-01-14 ENCOUNTER — Other Ambulatory Visit: Payer: Self-pay | Admitting: Family Medicine

## 2023-01-14 DIAGNOSIS — E7800 Pure hypercholesterolemia, unspecified: Secondary | ICD-10-CM

## 2023-01-14 DIAGNOSIS — R7303 Prediabetes: Secondary | ICD-10-CM

## 2023-01-14 DIAGNOSIS — Z8739 Personal history of other diseases of the musculoskeletal system and connective tissue: Secondary | ICD-10-CM

## 2023-01-23 ENCOUNTER — Ambulatory Visit (INDEPENDENT_AMBULATORY_CARE_PROVIDER_SITE_OTHER): Payer: No Typology Code available for payment source | Admitting: Family Medicine

## 2023-01-23 ENCOUNTER — Encounter: Payer: Self-pay | Admitting: Family Medicine

## 2023-01-23 VITALS — BP 124/74 | HR 61 | Temp 97.1°F | Wt 220.4 lb

## 2023-01-23 DIAGNOSIS — E291 Testicular hypofunction: Secondary | ICD-10-CM | POA: Diagnosis not present

## 2023-01-23 DIAGNOSIS — E559 Vitamin D deficiency, unspecified: Secondary | ICD-10-CM | POA: Diagnosis not present

## 2023-01-23 DIAGNOSIS — N1831 Chronic kidney disease, stage 3a: Secondary | ICD-10-CM

## 2023-01-23 DIAGNOSIS — Z8739 Personal history of other diseases of the musculoskeletal system and connective tissue: Secondary | ICD-10-CM

## 2023-01-23 DIAGNOSIS — I10 Essential (primary) hypertension: Secondary | ICD-10-CM

## 2023-01-23 DIAGNOSIS — R944 Abnormal results of kidney function studies: Secondary | ICD-10-CM

## 2023-01-23 DIAGNOSIS — E1122 Type 2 diabetes mellitus with diabetic chronic kidney disease: Secondary | ICD-10-CM

## 2023-01-23 DIAGNOSIS — E538 Deficiency of other specified B group vitamins: Secondary | ICD-10-CM | POA: Diagnosis not present

## 2023-01-23 DIAGNOSIS — E7800 Pure hypercholesterolemia, unspecified: Secondary | ICD-10-CM

## 2023-01-23 LAB — URINALYSIS, ROUTINE W REFLEX MICROSCOPIC
Bilirubin Urine: NEGATIVE
Hgb urine dipstick: NEGATIVE
Ketones, ur: NEGATIVE
Leukocytes,Ua: NEGATIVE
Nitrite: NEGATIVE
RBC / HPF: NONE SEEN (ref 0–?)
Specific Gravity, Urine: 1.02 (ref 1.000–1.030)
Total Protein, Urine: NEGATIVE
Urine Glucose: NEGATIVE
Urobilinogen, UA: 0.2 (ref 0.0–1.0)
WBC, UA: NONE SEEN (ref 0–?)
pH: 6 (ref 5.0–8.0)

## 2023-01-23 LAB — COMPREHENSIVE METABOLIC PANEL
ALT: 41 U/L (ref 0–53)
AST: 23 U/L (ref 0–37)
Albumin: 4.6 g/dL (ref 3.5–5.2)
Alkaline Phosphatase: 63 U/L (ref 39–117)
BUN: 28 mg/dL — ABNORMAL HIGH (ref 6–23)
CO2: 28 mEq/L (ref 19–32)
Calcium: 9.2 mg/dL (ref 8.4–10.5)
Chloride: 102 mEq/L (ref 96–112)
Creatinine, Ser: 1.57 mg/dL — ABNORMAL HIGH (ref 0.40–1.50)
GFR: 48.93 mL/min — ABNORMAL LOW (ref >60.00)
Glucose, Bld: 151 mg/dL — ABNORMAL HIGH (ref 70–99)
Potassium: 4 mEq/L (ref 3.5–5.1)
Sodium: 140 mEq/L (ref 135–145)
Total Bilirubin: 0.6 mg/dL (ref 0.2–1.2)
Total Protein: 7.1 g/dL (ref 6.0–8.3)

## 2023-01-23 LAB — CBC
HCT: 38.6 % — ABNORMAL LOW (ref 39.0–52.0)
Hemoglobin: 13.4 g/dL (ref 13.0–17.0)
MCHC: 34.6 g/dL (ref 30.0–36.0)
MCV: 86.8 fl (ref 78.0–100.0)
Platelets: 158 10*3/uL (ref 150.0–400.0)
RBC: 4.45 Mil/uL (ref 4.22–5.81)
RDW: 13.5 % (ref 11.5–15.5)
WBC: 4.1 10*3/uL (ref 4.0–10.5)

## 2023-01-23 LAB — LIPID PANEL
Cholesterol: 148 mg/dL (ref 0–200)
HDL: 35.6 mg/dL — ABNORMAL LOW (ref >39.00)
NonHDL: 112.27
Total CHOL/HDL Ratio: 4
Triglycerides: 253 mg/dL — ABNORMAL HIGH (ref 0.0–149.0)
VLDL: 50.6 mg/dL — ABNORMAL HIGH (ref 0.0–40.0)

## 2023-01-23 LAB — MICROALBUMIN / CREATININE URINE RATIO
Creatinine,U: 162.5 mg/dL
Microalb Creat Ratio: 2 mg/g (ref 0.0–30.0)
Microalb, Ur: 3.2 mg/dL — ABNORMAL HIGH (ref 0.0–1.9)

## 2023-01-23 LAB — HEMOGLOBIN A1C: Hgb A1c MFr Bld: 7.8 % — ABNORMAL HIGH (ref 4.6–6.5)

## 2023-01-23 LAB — VITAMIN B12: Vitamin B-12: 332 pg/mL (ref 211–911)

## 2023-01-23 LAB — VITAMIN D 25 HYDROXY (VIT D DEFICIENCY, FRACTURES): VITD: 39.12 ng/mL (ref 30.00–100.00)

## 2023-01-23 LAB — URIC ACID: Uric Acid, Serum: 8.5 mg/dL — ABNORMAL HIGH (ref 4.0–7.8)

## 2023-01-23 LAB — LDL CHOLESTEROL, DIRECT: Direct LDL: 76 mg/dL

## 2023-01-23 MED ORDER — AMLODIPINE BESYLATE 5 MG PO TABS: 5.0000 mg | ORAL_TABLET | Freq: Every day | ORAL | 2 refills | Status: DC

## 2023-01-23 MED ORDER — ALLOPURINOL 100 MG PO TABS: ORAL_TABLET | ORAL | 1 refills | Status: DC

## 2023-01-23 MED ORDER — VITAMIN D (ERGOCALCIFEROL) 1.25 MG (50000 UNIT) PO CAPS: 50000.0000 [IU] | ORAL_CAPSULE | ORAL | 2 refills | Status: DC

## 2023-01-23 MED ORDER — CYANOCOBALAMIN 1000 MCG/ML IJ SOLN: INTRAMUSCULAR | 5 refills | Status: AC

## 2023-01-23 MED ORDER — METOPROLOL SUCCINATE ER 25 MG PO TB24: 25.0000 mg | ORAL_TABLET | Freq: Every day | ORAL | 2 refills | Status: DC

## 2023-01-23 MED ORDER — TESTOSTERONE CYPIONATE 200 MG/ML IM SOLN: INTRAMUSCULAR | 1 refills | Status: DC

## 2023-01-23 NOTE — Progress Notes (Addendum)
Established Patient Office Visit   Subjective:  Patient ID: Henry Hernandez, male    DOB: 10-14-1966  Age: 57 y.o. MRN: [id number]  Chief Complaint  Patient presents with   Follow-up    Follow up on DM, no concerns. Patient fasting.     HPI Encounter Diagnoses  Name Primary?   Decreased GFR Yes   History of gout    Essential hypertension    Androgen deficiency    Vitamin D deficiency    B12 deficiency    Type 2 diabetes mellitus with stage 3a chronic kidney disease, without long-term current use of insulin (HCC)    Elevated cholesterol    For follow-up of above.  Feeling okay.  Far less stress in his life at this point.  Would like to hold the fluoxetine.  He is contemplating an early retirement or cutting back his hours.  Continues with testosterone but is not certain that it is making much of a difference.   Review of Systems  Constitutional: Negative.   HENT: Negative.    Eyes:  Negative for blurred vision, discharge and redness.  Respiratory: Negative.    Cardiovascular: Negative.   Gastrointestinal:  Negative for abdominal pain.  Genitourinary: Negative.   Musculoskeletal: Negative.  Negative for myalgias.  Skin:  Negative for rash.  Neurological:  Negative for tingling, loss of consciousness and weakness.  Endo/Heme/Allergies:  Negative for polydipsia.   Diabetic Foot Exam - Simple   Simple Foot Form Diabetic Foot exam was performed with the following findings: Yes 01/23/2023  8:57 AM  Visual Inspection No deformities, no ulcerations, no other skin breakdown bilaterally: Yes Sensation Testing Intact to touch and monofilament testing bilaterally: Yes Pulse Check Posterior Tibialis and Dorsalis pulse intact bilaterally: Yes Comments      Current Outpatient Medications:    allopurinol (ZYLOPRIM) 100 MG tablet, Take 2 tablets (200 mg total) by mouth daily., Disp: 180 tablet, Rfl: 2   aspirin EC 81 MG tablet, Take 1 tablet by mouth daily., Disp: , Rfl:     colchicine 0.6 MG tablet, Take 1 tablet (0.6 mg total) by mouth 2 (two) times daily. For 5-7 days as needed for gouty flairs., Disp: 30 tablet, Rfl: 3   empagliflozin (JARDIANCE) 10 MG TABS tablet, Take 1 tablet (10 mg total) by mouth daily before breakfast., Disp: 90 tablet, Rfl: 1   meloxicam (MOBIC) 15 MG tablet, TAKE 1 TABLET BY MOUTH DAILY AS NEEDED WITH FOOD., Disp: 30 tablet, Rfl: 2   metFORMIN (GLUCOPHAGE-XR) 500 MG 24 hr tablet, TAKE 1 TABLET NIGHTY PRIOR TO BED, Disp: 90 tablet, Rfl: 1   olmesartan-hydrochlorothiazide (BENICAR HCT) 40-25 MG tablet, TAKE 1 TABLET BY MOUTH EVERY DAY, Disp: 90 tablet, Rfl: 2   Omega-3 Fatty Acids (FISH OIL BURP-LESS) 1000 MG CAPS, Take 2,000 mg by mouth daily., Disp: 180 capsule, Rfl: 2   rosuvastatin (CRESTOR) 20 MG tablet, TAKE 1 TABLET BY MOUTH EVERY DAY, Disp: 90 tablet, Rfl: 3   amLODipine (NORVASC) 5 MG tablet, Take 1 tablet (5 mg total) by mouth daily., Disp: 90 tablet, Rfl: 2   cyanocobalamin (VITAMIN B12) 1000 MCG/ML injection, INJECT 1 ML (1,000 MCG TOTAL) INTO THE MUSCLE ONCE FOR 1 DOSE., Disp: 3 mL, Rfl: 5   metoprolol succinate (TOPROL-XL) 25 MG 24 hr tablet, Take 1 tablet (25 mg total) by mouth daily., Disp: 90 tablet, Rfl: 2   testosterone cypionate (DEPOTESTOSTERONE CYPIONATE) 200 MG/ML injection, INJECT 1 ML (200 MG TOTAL) INTO THE MUSCLE EVERY 14  DAYS, Disp: 6 mL, Rfl: 1   Vitamin D, Ergocalciferol, (DRISDOL) 1.25 MG (50000 UNIT) CAPS capsule, Take 1 capsule (50,000 Units total) by mouth every 7 (seven) days., Disp: 12 capsule, Rfl: 2   Objective:     BP 124/74 (BP Location: Right Arm, Patient Position: Sitting, Cuff Size: Normal)   Pulse 61   Temp (!) 97.1 F (36.2 C)   Wt 220 lb 6.4 oz (100 kg)   SpO2 96%   BMI 31.62 kg/m  Wt Readings from Last 3 Encounters:  01/23/23 220 lb 6.4 oz (100 kg)  08/01/22 211 lb 3.2 oz (95.8 kg)  07/09/22 204 lb 9.6 oz (92.8 kg)      Physical Exam Constitutional:      General: He is not in acute  distress.    Appearance: Normal appearance. He is not ill-appearing, toxic-appearing or diaphoretic.  HENT:     Head: Normocephalic and atraumatic.     Right Ear: External ear normal.     Left Ear: External ear normal.     Mouth/Throat:     Mouth: Mucous membranes are moist.     Pharynx: Oropharynx is clear. No oropharyngeal exudate or posterior oropharyngeal erythema.  Eyes:     General: No scleral icterus.       Right eye: No discharge.        Left eye: No discharge.     Extraocular Movements: Extraocular movements intact.     Conjunctiva/sclera: Conjunctivae normal.     Pupils: Pupils are equal, round, and reactive to light.  Cardiovascular:     Rate and Rhythm: Normal rate and regular rhythm.  Pulmonary:     Effort: Pulmonary effort is normal. No respiratory distress.     Breath sounds: Normal breath sounds.  Abdominal:     General: Bowel sounds are normal.  Musculoskeletal:     Cervical back: No rigidity or tenderness.  Lymphadenopathy:     Cervical: No cervical adenopathy.  Skin:    General: Skin is warm and dry.  Neurological:     Mental Status: He is alert and oriented to person, place, and time.  Psychiatric:        Mood and Affect: Mood normal.        Behavior: Behavior normal.      Results for orders placed or performed in visit on 01/23/23  Vitamin B12  Result Value Ref Range   Vitamin B-12 332 211 - 911 pg/mL  CBC  Result Value Ref Range   WBC 4.1 4.0 - 10.5 K/uL   RBC 4.45 4.22 - 5.81 Mil/uL   Platelets 158.0 150.0 - 400.0 K/uL   Hemoglobin 13.4 13.0 - 17.0 g/dL   HCT 83.1 (L) 51.7 - 61.6 %   MCV 86.8 78.0 - 100.0 fl   MCHC 34.6 30.0 - 36.0 g/dL   RDW 07.3 71.0 - 62.6 %  Comprehensive metabolic panel  Result Value Ref Range   Sodium 140 135 - 145 mEq/L   Potassium 4.0 3.5 - 5.1 mEq/L   Chloride 102 96 - 112 mEq/L   CO2 28 19 - 32 mEq/L   Glucose, Bld 151 (H) 70 - 99 mg/dL   BUN 28 (H) 6 - 23 mg/dL   Creatinine, Ser 9.48 (H) 0.40 - 1.50 mg/dL    Total Bilirubin 0.6 0.2 - 1.2 mg/dL   Alkaline Phosphatase 63 39 - 117 U/L   AST 23 0 - 37 U/L   ALT 41 0 - 53 U/L  Total Protein 7.1 6.0 - 8.3 g/dL   Albumin 4.6 3.5 - 5.2 g/dL   GFR 48.93 (L) >60.00 mL/min   Calcium 9.2 8.4 - 10.5 mg/dL  Hemoglobin A1c  Result Value Ref Range   Hgb A1c MFr Bld 7.8 (H) 4.6 - 6.5 %  Lipid panel  Result Value Ref Range   Cholesterol 148 0 - 200 mg/dL   Triglycerides 253.0 (H) 0.0 - 149.0 mg/dL   HDL 35.60 (L) >39.00 mg/dL   VLDL 50.6 (H) 0.0 - 40.0 mg/dL   Total CHOL/HDL Ratio 4    NonHDL 112.27   Urinalysis, Routine w reflex microscopic  Result Value Ref Range   Color, Urine YELLOW Yellow;Lt. Yellow;Straw;Dark Yellow;Amber;Green;Red;Brown   APPearance CLEAR Clear;Turbid;Slightly Cloudy;Cloudy   Specific Gravity, Urine 1.020 1.000 - 1.030   pH 6.0 5.0 - 8.0   Total Protein, Urine NEGATIVE Negative   Urine Glucose NEGATIVE Negative   Ketones, ur NEGATIVE Negative   Bilirubin Urine NEGATIVE Negative   Hgb urine dipstick NEGATIVE Negative   Urobilinogen, UA 0.2 0.0 - 1.0   Leukocytes,Ua NEGATIVE Negative   Nitrite NEGATIVE Negative   WBC, UA none seen 0-2/hpf   RBC / HPF none seen 0-2/hpf   Mucus, UA Presence of (A) None  Uric acid  Result Value Ref Range   Uric Acid, Serum 8.5 (H) 4.0 - 7.8 mg/dL  Microalbumin / creatinine urine ratio  Result Value Ref Range   Microalb, Ur 3.2 (H) 0.0 - 1.9 mg/dL   Creatinine,U 162.5 mg/dL   Microalb Creat Ratio 2.0 0.0 - 30.0 mg/g  VITAMIN D 25 Hydroxy (Vit-D Deficiency, Fractures)  Result Value Ref Range   VITD 39.12 30.00 - 100.00 ng/mL  LDL cholesterol, direct  Result Value Ref Range   Direct LDL 76.0 mg/dL      The 10-year ASCVD risk score (Arnett DK, et al., 2019) is: 12.1%    Assessment & Plan:   Decreased GFR -     Comprehensive metabolic panel -     Empagliflozin; Take 1 tablet (10 mg total) by mouth daily before breakfast.  Dispense: 90 tablet; Refill: 1  History of gout -      Comprehensive metabolic panel -     Uric acid -     Allopurinol; Take 2 tablets (200 mg total) by mouth daily.  Dispense: 180 tablet; Refill: 2  Essential hypertension -     amLODIPine Besylate; Take 1 tablet (5 mg total) by mouth daily.  Dispense: 90 tablet; Refill: 2 -     Metoprolol Succinate ER; Take 1 tablet (25 mg total) by mouth daily.  Dispense: 90 tablet; Refill: 2 -     CBC -     Comprehensive metabolic panel -     Urinalysis, Routine w reflex microscopic -     Microalbumin / creatinine urine ratio  Androgen deficiency -     Testosterone Cypionate; INJECT 1 ML (200 MG TOTAL) INTO THE MUSCLE EVERY 14 DAYS  Dispense: 6 mL; Refill: 1 -     CBC  Vitamin D deficiency -     Vitamin D (Ergocalciferol); Take 1 capsule (50,000 Units total) by mouth every 7 (seven) days.  Dispense: 12 capsule; Refill: 2 -     VITAMIN D 25 Hydroxy (Vit-D Deficiency, Fractures)  B12 deficiency -     Vitamin B12  Type 2 diabetes mellitus with stage 3a chronic kidney disease, without long-term current use of insulin (HCC) -     Comprehensive metabolic  panel -     Hemoglobin A1c -     Urinalysis, Routine w reflex microscopic -     Microalbumin / creatinine urine ratio -     Empagliflozin; Take 1 tablet (10 mg total) by mouth daily before breakfast.  Dispense: 90 tablet; Refill: 1  Elevated cholesterol -     Comprehensive metabolic panel -     Lipid panel  Other orders -     Cyanocobalamin; INJECT 1 ML (1,000 MCG TOTAL) INTO THE MUSCLE ONCE FOR 1 DOSE.  Dispense: 3 mL; Refill: 5 -     LDL cholesterol, direct    Return in about 3 months (around 04/24/2023), or if symptoms worsen or fail to improve.  Patient would like to hold fluoxetine and see how things go.  I agree.  There is much less stress in his life at this point.  He may decide to discontinue testosterone.  Pending results of today's labs will consider an SGLT2 inhibitor.  Libby Maw, MD

## 2023-01-24 MED ORDER — EMPAGLIFLOZIN 10 MG PO TABS: 10.0000 mg | ORAL_TABLET | Freq: Every day | ORAL | 1 refills | Status: DC

## 2023-01-24 MED ORDER — ALLOPURINOL 100 MG PO TABS: 200.0000 mg | ORAL_TABLET | Freq: Every day | ORAL | 2 refills | Status: DC

## 2023-01-24 NOTE — Addendum Note (Signed)
Addended by: Jon Billings on: 01/24/2023 08:16 AM   Modules accepted: Orders

## 2023-04-20 ENCOUNTER — Other Ambulatory Visit: Payer: Self-pay | Admitting: Family Medicine

## 2023-04-20 DIAGNOSIS — I10 Essential (primary) hypertension: Secondary | ICD-10-CM

## 2023-05-23 ENCOUNTER — Ambulatory Visit (INDEPENDENT_AMBULATORY_CARE_PROVIDER_SITE_OTHER): Payer: No Typology Code available for payment source | Admitting: Family Medicine

## 2023-05-23 ENCOUNTER — Encounter: Payer: Self-pay | Admitting: Family Medicine

## 2023-05-23 VITALS — BP 122/70 | HR 74 | Temp 98.1°F | Ht 70.0 in | Wt 217.6 lb

## 2023-05-23 DIAGNOSIS — E1122 Type 2 diabetes mellitus with diabetic chronic kidney disease: Secondary | ICD-10-CM | POA: Diagnosis not present

## 2023-05-23 DIAGNOSIS — I10 Essential (primary) hypertension: Secondary | ICD-10-CM | POA: Diagnosis not present

## 2023-05-23 DIAGNOSIS — N1831 Chronic kidney disease, stage 3a: Secondary | ICD-10-CM | POA: Diagnosis not present

## 2023-05-23 DIAGNOSIS — E7800 Pure hypercholesterolemia, unspecified: Secondary | ICD-10-CM | POA: Diagnosis not present

## 2023-05-23 DIAGNOSIS — E538 Deficiency of other specified B group vitamins: Secondary | ICD-10-CM | POA: Diagnosis not present

## 2023-05-23 DIAGNOSIS — Z8739 Personal history of other diseases of the musculoskeletal system and connective tissue: Secondary | ICD-10-CM

## 2023-05-23 DIAGNOSIS — Z7984 Long term (current) use of oral hypoglycemic drugs: Secondary | ICD-10-CM

## 2023-05-23 DIAGNOSIS — Z1211 Encounter for screening for malignant neoplasm of colon: Secondary | ICD-10-CM

## 2023-05-23 DIAGNOSIS — R351 Nocturia: Secondary | ICD-10-CM

## 2023-05-23 DIAGNOSIS — Z0000 Encounter for general adult medical examination without abnormal findings: Secondary | ICD-10-CM

## 2023-05-23 LAB — CBC
HCT: 39.5 % (ref 39.0–52.0)
Hemoglobin: 13.5 g/dL (ref 13.0–17.0)
MCHC: 34.2 g/dL (ref 30.0–36.0)
MCV: 87.6 fl (ref 78.0–100.0)
Platelets: 171 10*3/uL (ref 150.0–400.0)
RBC: 4.51 Mil/uL (ref 4.22–5.81)
RDW: 13.1 % (ref 11.5–15.5)
WBC: 3.9 10*3/uL — ABNORMAL LOW (ref 4.0–10.5)

## 2023-05-23 LAB — HEMOGLOBIN A1C: Hgb A1c MFr Bld: 7.2 % — ABNORMAL HIGH (ref 4.6–6.5)

## 2023-05-23 LAB — COMPREHENSIVE METABOLIC PANEL
ALT: 44 U/L (ref 0–53)
AST: 27 U/L (ref 0–37)
Albumin: 4.5 g/dL (ref 3.5–5.2)
Alkaline Phosphatase: 59 U/L (ref 39–117)
BUN: 31 mg/dL — ABNORMAL HIGH (ref 6–23)
CO2: 28 mEq/L (ref 19–32)
Calcium: 9.5 mg/dL (ref 8.4–10.5)
Chloride: 103 mEq/L (ref 96–112)
Creatinine, Ser: 1.75 mg/dL — ABNORMAL HIGH (ref 0.40–1.50)
GFR: 42.86 mL/min — ABNORMAL LOW (ref >60.00)
Glucose, Bld: 131 mg/dL — ABNORMAL HIGH (ref 70–99)
Potassium: 4 mEq/L (ref 3.5–5.1)
Sodium: 141 mEq/L (ref 135–145)
Total Bilirubin: 0.6 mg/dL (ref 0.2–1.2)
Total Protein: 7 g/dL (ref 6.0–8.3)

## 2023-05-23 LAB — VITAMIN B12: Vitamin B-12: 355 pg/mL (ref 211–911)

## 2023-05-23 LAB — URIC ACID: Uric Acid, Serum: 7.2 mg/dL (ref 4.0–7.8)

## 2023-05-23 LAB — PSA: PSA: 1.54 ng/mL (ref 0.10–4.00)

## 2023-05-23 MED ORDER — ROSUVASTATIN CALCIUM 20 MG PO TABS: 20.0000 mg | ORAL_TABLET | Freq: Every day | ORAL | 3 refills | Status: DC

## 2023-05-23 MED ORDER — METOPROLOL SUCCINATE ER 25 MG PO TB24: 25.0000 mg | ORAL_TABLET | Freq: Every day | ORAL | 2 refills | Status: DC

## 2023-05-23 MED ORDER — AMLODIPINE BESYLATE 5 MG PO TABS: 5.0000 mg | ORAL_TABLET | Freq: Every day | ORAL | 2 refills | Status: DC

## 2023-05-23 MED ORDER — OLMESARTAN MEDOXOMIL-HCTZ 40-25 MG PO TABS: 1.0000 | ORAL_TABLET | Freq: Every day | ORAL | 2 refills | Status: DC

## 2023-05-23 MED ORDER — ALLOPURINOL 100 MG PO TABS: 200.0000 mg | ORAL_TABLET | Freq: Every day | ORAL | 2 refills | Status: DC

## 2023-05-23 NOTE — Progress Notes (Signed)
Established Patient Office Visit   Subjective:  Patient ID: Henry Hernandez, male    DOB: 01-May-1966  Age: 57 y.o. MRN: [id number]  Chief Complaint  Patient presents with   Medical Management of Chronic Issues    3 month follow up, no concerns. Patient fasting.     HPI Encounter Diagnoses  Name Primary?   Type 2 diabetes mellitus with stage 3a chronic kidney disease, without long-term current use of insulin (HCC) Yes   History of gout    Essential hypertension    Elevated cholesterol    B12 deficiency    Healthcare maintenance    Screening for colon cancer    Nocturia    Follow-up of above.  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  Not exercising as much as he would like.  He does have regular dental care.  He has seen the eye doctor but it does not sound like a dilated exam was performed.  It sounds as though he may be having some trouble with dry eyes.  Increased frequency of urination with Jardiance.  Fasting sugars remain in the 130s.  Continues with metformin.  Blood pressure well-controlled with current regimen.  Continues to 100 mg of allopurinol for gout.  Continues high-dose B12 and weekly high-dose vitamin D tablet.  Nocturia x 3 but is drinking a lot of water right up until bedtime.  Things are okay at home.  90 year old daughter is being worked up for IgA deficiency.   Review of Systems  Constitutional: Negative.   HENT: Negative.    Eyes:  Negative for blurred vision, discharge and redness.  Respiratory: Negative.    Cardiovascular: Negative.   Gastrointestinal:  Negative for abdominal pain.  Genitourinary: Negative.   Musculoskeletal: Negative.  Negative for myalgias.  Skin:  Negative for rash.  Neurological:  Negative for tingling, loss of consciousness and weakness.  Endo/Heme/Allergies:  Negative for polydipsia.     Current Outpatient Medications:    aspirin EC 81 MG tablet, Take 1 tablet by mouth daily., Disp: , Rfl:    colchicine 0.6 MG tablet, Take 1  tablet (0.6 mg total) by mouth 2 (two) times daily. For 5-7 days as needed for gouty flairs., Disp: 30 tablet, Rfl: 3   cyanocobalamin (VITAMIN B12) 1000 MCG/ML injection, INJECT 1 ML (1,000 MCG TOTAL) INTO THE MUSCLE ONCE FOR 1 DOSE., Disp: 3 mL, Rfl: 5   empagliflozin (JARDIANCE) 25 MG TABS tablet, Take 1 tablet (25 mg total) by mouth daily before breakfast., Disp: 90 tablet, Rfl: 1   meloxicam (MOBIC) 15 MG tablet, TAKE 1 TABLET BY MOUTH DAILY AS NEEDED WITH FOOD., Disp: 30 tablet, Rfl: 2   metFORMIN (GLUCOPHAGE-XR) 500 MG 24 hr tablet, TAKE 1 TABLET NIGHTY PRIOR TO BED, Disp: 90 tablet, Rfl: 1   Omega-3 Fatty Acids (FISH OIL BURP-LESS) 1000 MG CAPS, Take 2,000 mg by mouth daily., Disp: 180 capsule, Rfl: 2   testosterone cypionate (DEPOTESTOSTERONE CYPIONATE) 200 MG/ML injection, INJECT 1 ML (200 MG TOTAL) INTO THE MUSCLE EVERY 14 DAYS, Disp: 6 mL, Rfl: 1   Vitamin D, Ergocalciferol, (DRISDOL) 1.25 MG (50000 UNIT) CAPS capsule, Take 1 capsule (50,000 Units total) by mouth every 7 (seven) days., Disp: 12 capsule, Rfl: 2   allopurinol (ZYLOPRIM) 100 MG tablet, Take 2 tablets (200 mg total) by mouth daily., Disp: 180 tablet, Rfl: 2   amLODipine (NORVASC) 5 MG tablet, Take 1 tablet (5 mg total) by mouth daily., Disp: 90 tablet, Rfl: 2   metoprolol  succinate (TOPROL-XL) 25 MG 24 hr tablet, Take 1 tablet (25 mg total) by mouth daily., Disp: 90 tablet, Rfl: 2   olmesartan-hydrochlorothiazide (BENICAR HCT) 40-25 MG tablet, Take 1 tablet by mouth daily., Disp: 90 tablet, Rfl: 2   rosuvastatin (CRESTOR) 20 MG tablet, Take 1 tablet (20 mg total) by mouth daily., Disp: 90 tablet, Rfl: 3   Objective:     BP 122/70 (BP Location: Right Arm, Patient Position: Sitting, Cuff Size: Normal)   Pulse 74   Temp 98.1 F (36.7 C) (Temporal)   Ht 5\' 10"  (1.778 m)   Wt 217 lb 9.6 oz (98.7 kg)   SpO2 96%   BMI 31.22 kg/m    Physical Exam Constitutional:      General: He is not in acute distress.    Appearance:  Normal appearance. He is not ill-appearing, toxic-appearing or diaphoretic.  HENT:     Head: Normocephalic and atraumatic.     Right Ear: Tympanic membrane, ear canal and external ear normal.     Left Ear: Tympanic membrane, ear canal and external ear normal.     Mouth/Throat:     Mouth: Mucous membranes are moist.     Pharynx: Oropharynx is clear. No oropharyngeal exudate or posterior oropharyngeal erythema.  Eyes:     General: No scleral icterus.       Right eye: No discharge.        Left eye: No discharge.     Extraocular Movements: Extraocular movements intact.     Conjunctiva/sclera: Conjunctivae normal.     Pupils: Pupils are equal, round, and reactive to light.  Cardiovascular:     Rate and Rhythm: Normal rate and regular rhythm.  Pulmonary:     Effort: Pulmonary effort is normal. No respiratory distress.     Breath sounds: Normal breath sounds.  Abdominal:     General: Bowel sounds are normal.     Tenderness: There is no abdominal tenderness. There is no guarding.  Musculoskeletal:     Cervical back: No rigidity or tenderness.  Lymphadenopathy:     Cervical: No cervical adenopathy.  Skin:    General: Skin is warm and dry.  Neurological:     Mental Status: He is alert and oriented to person, place, and time.  Psychiatric:        Mood and Affect: Mood normal.        Behavior: Behavior normal.      Results for orders placed or performed in visit on 05/23/23  Vitamin B12  Result Value Ref Range   Vitamin B-12 355 211 - 911 pg/mL  CBC  Result Value Ref Range   WBC 3.9 (L) 4.0 - 10.5 K/uL   RBC 4.51 4.22 - 5.81 Mil/uL   Platelets 171.0 150.0 - 400.0 K/uL   Hemoglobin 13.5 13.0 - 17.0 g/dL   HCT 95.6 21.3 - 08.6 %   MCV 87.6 78.0 - 100.0 fl   MCHC 34.2 30.0 - 36.0 g/dL   RDW 57.8 46.9 - 62.9 %  Comprehensive metabolic panel  Result Value Ref Range   Sodium 141 135 - 145 mEq/L   Potassium 4.0 3.5 - 5.1 mEq/L   Chloride 103 96 - 112 mEq/L   CO2 28 19 - 32 mEq/L    Glucose, Bld 131 (H) 70 - 99 mg/dL   BUN 31 (H) 6 - 23 mg/dL   Creatinine, Ser 5.28 (H) 0.40 - 1.50 mg/dL   Total Bilirubin 0.6 0.2 - 1.2 mg/dL   Alkaline  Phosphatase 59 39 - 117 U/L   AST 27 0 - 37 U/L   ALT 44 0 - 53 U/L   Total Protein 7.0 6.0 - 8.3 g/dL   Albumin 4.5 3.5 - 5.2 g/dL   GFR 16.10 (L) >96.04 mL/min   Calcium 9.5 8.4 - 10.5 mg/dL  Hemoglobin V4U  Result Value Ref Range   Hgb A1c MFr Bld 7.2 (H) 4.6 - 6.5 %  PSA  Result Value Ref Range   PSA 1.54 0.10 - 4.00 ng/mL  Uric acid  Result Value Ref Range   Uric Acid, Serum 7.2 4.0 - 7.8 mg/dL      The [id number] ASCVD risk score (Arnett DK, et al., 2019) is: 11.8%    Assessment & Plan:   Type 2 diabetes mellitus with stage 3a chronic kidney disease, without long-term current use of insulin (HCC) -     Comprehensive metabolic panel -     Hemoglobin A1c -     Ambulatory referral to Ophthalmology -     Empagliflozin; Take 1 tablet (25 mg total) by mouth daily before breakfast.  Dispense: 90 tablet; Refill: 1  History of gout -     Allopurinol; Take 2 tablets (200 mg total) by mouth daily.  Dispense: 180 tablet; Refill: 2 -     Comprehensive metabolic panel -     Uric acid  Essential hypertension -     amLODIPine Besylate; Take 1 tablet (5 mg total) by mouth daily.  Dispense: 90 tablet; Refill: 2 -     Metoprolol Succinate ER; Take 1 tablet (25 mg total) by mouth daily.  Dispense: 90 tablet; Refill: 2 -     Olmesartan Medoxomil-HCTZ; Take 1 tablet by mouth daily.  Dispense: 90 tablet; Refill: 2 -     CBC -     Comprehensive metabolic panel  Elevated cholesterol -     Rosuvastatin Calcium; Take 1 tablet (20 mg total) by mouth daily.  Dispense: 90 tablet; Refill: 3  B12 deficiency -     Vitamin B12  Healthcare maintenance -     PSA  Screening for colon cancer -     Ambulatory referral to Gastroenterology  Nocturia    Return in about 3 months (around 08/23/2023).  Information was given on health  maintenance and disease prevention.  Encouraged to exercise.  Considering decreased work hours.  Asked him to hold fluids a couple hours before bedtime.  Will be adjusting Jardiance and metformin pending results of A1c and GFR.  Mliss Sax, MD  5/28 addendum: Have increased empagliflozin to 25 mg daily.

## 2023-05-27 MED ORDER — EMPAGLIFLOZIN 25 MG PO TABS
25.0000 mg | ORAL_TABLET | Freq: Every day | ORAL | 1 refills | Status: DC
Start: 2023-05-27 — End: 2023-11-24

## 2023-05-27 NOTE — Addendum Note (Signed)
Addended by: Andrez Grime on: 05/27/2023 08:03 AM   Modules accepted: Orders

## 2023-07-12 ENCOUNTER — Other Ambulatory Visit: Payer: Self-pay | Admitting: Family Medicine

## 2023-07-12 DIAGNOSIS — R7303 Prediabetes: Secondary | ICD-10-CM

## 2023-07-15 ENCOUNTER — Telehealth: Payer: Self-pay | Admitting: Gastroenterology

## 2023-07-15 NOTE — Telephone Encounter (Signed)
He was referred directly for colonoscopy for family history of colon cancer.  Okay to book for colonoscopy at the [facility]  as long as it he meets criteria to have it done there otherwise. Thanks

## 2023-07-15 NOTE — Telephone Encounter (Signed)
Hi Dr. Adela Lank,  Supervising Provider: 07/15/23-AM   We received a referral for patient to have a colonoscopy, patient does have GI history and his records were obtained and scanned into [employer] for you to review and advise on scheduling.    Thank you

## 2023-07-17 NOTE — Telephone Encounter (Signed)
Called patient to advise and schedule left voicemail. 

## 2023-07-18 ENCOUNTER — Encounter: Payer: Self-pay | Admitting: Gastroenterology

## 2023-09-04 ENCOUNTER — Ambulatory Visit: Payer: No Typology Code available for payment source | Admitting: *Deleted

## 2023-09-04 VITALS — Ht 70.0 in | Wt 208.0 lb

## 2023-09-04 DIAGNOSIS — Z800 Family history of malignant neoplasm of digestive organs: Secondary | ICD-10-CM

## 2023-09-04 MED ORDER — NA SULFATE-K SULFATE-MG SULF 17.5-3.13-1.6 GM/177ML PO SOLN
1.0000 | Freq: Once | ORAL | 0 refills | Status: AC
Start: 2023-09-04 — End: 2023-09-04

## 2023-09-04 NOTE — Progress Notes (Signed)
Pt's name and DOB verified at the beginning of the pre-visit.  Pt denies any difficulty with ambulating,sitting, laying down or rolling side to side Gave both [facility] main # and MD on call # prior to instructions.  No egg or soy allergy known to patient  No issues known to pt with past sedation with any surgeries or procedures Pt denies having issues being intubated Pt has no issues moving head neck or swallowing No FH of Malignant Hyperthermia Pt is not on diet pills Pt is not on home 02  Pt is not on blood thinners   Pt has frequent issues with constipation RN instructed pt to use Miralax per bottles instructions a week before prep days. Pt states they will Pt is not on dialysis Pt has a irregular heart beat heart rhythms  Pt denies any upcoming cardiac testing Pt encouraged to use to use Singlecare or Goodrx to reduce cost  Patient's chart reviewed by Cathlyn Parsons CNRA prior to pre-visit and patient appropriate for the [facility].  Pre-visit completed and red dot placed by patient's name on their procedure day (on provider's schedule).  . Visit by phone Pt states weight is 208 lb Instructed pt why it is important to and  to call if they have any changes in health or new medications. Directed them to the # given and on instructions.   Pt states they will.  Instructions reviewed with pt and pt states understanding. Instructed to review again prior to procedure. Pt states they will.  Instructions sent by mail with coupon and by my chart

## 2023-09-10 ENCOUNTER — Encounter: Payer: Self-pay | Admitting: Gastroenterology

## 2023-09-23 ENCOUNTER — Ambulatory Visit: Payer: No Typology Code available for payment source | Admitting: Gastroenterology

## 2023-09-23 ENCOUNTER — Encounter: Payer: Self-pay | Admitting: Gastroenterology

## 2023-09-23 VITALS — BP 126/76 | HR 72 | Temp 98.9°F | Resp 15 | Ht 70.0 in | Wt 208.0 lb

## 2023-09-23 DIAGNOSIS — D120 Benign neoplasm of cecum: Secondary | ICD-10-CM | POA: Diagnosis not present

## 2023-09-23 DIAGNOSIS — Z1211 Encounter for screening for malignant neoplasm of colon: Secondary | ICD-10-CM | POA: Diagnosis present

## 2023-09-23 NOTE — Progress Notes (Signed)
A/O x 3, gd SR's, VSS, report to RN

## 2023-09-23 NOTE — Op Note (Signed)
[facility] Patient Name: Henry Hernandez Procedure Date: 09/23/2023 8:23 AM MRN: [id number] Endoscopist: Viviann Spare P. Adela Lank , MD, [id number] Age: 57 Referring MD:  Date of Birth: 05/09/1966 Gender: Male Account #: [id number] Procedure:                Colonoscopy Indications:              Screening for colorectal malignant neoplasm -                            cousin / grandmother with history of colon cancer Medicines:                Monitored Anesthesia Care Procedure:                Pre-Anesthesia Assessment:                           - Prior to the procedure, a History and Physical                            was performed, and patient medications and                            allergies were reviewed. The patient's tolerance of                            previous anesthesia was also reviewed. The risks                            and benefits of the procedure and the sedation                            options and risks were discussed with the patient.                            All questions were answered, and informed consent                            was obtained. Prior Anticoagulants: The patient has                            taken no anticoagulant or antiplatelet agents. ASA                            Grade Assessment: III - A patient with severe                            systemic disease. After reviewing the risks and                            benefits, the patient was deemed in satisfactory                            condition to undergo the procedure.  After obtaining informed consent, the colonoscope                            was passed under direct vision. Throughout the                            procedure, the patient's blood pressure, pulse, and                            oxygen saturations were monitored continuously. The                            Olympus CF-HQ190L [id number]) Colonoscope was                            introduced  through the anus and advanced to the the                            cecum, identified by appendiceal orifice and                            ileocecal valve. The colonoscopy was performed                            without difficulty. The patient tolerated the                            procedure well. The quality of the bowel                            preparation was good. The ileocecal valve,                            appendiceal orifice, and rectum were photographed. Scope In: 8:33:03 AM Scope Out: 8:56:56 AM Scope Withdrawal Time: 0 hours 19 minutes 3 seconds  Total Procedure Duration: 0 hours 23 minutes 53 seconds  Findings:                 The perianal and digital rectal examinations were                            normal.                           A diminutive polyp was found in the cecum. The                            polyp was flat. The polyp was removed with a cold                            snare. Resection and retrieval were complete.                           A roughly 35 mm polyp was found in the cecum along  the back side of the IC valve / cecal cap. The                            polyp was sessile but rather thin and easy to                            remove with the snare. The polyp was removed with a                            piecemeal technique using a cold snare. Resection                            and retrieval were complete.                           A few small-mouthed diverticula were found in the                            sigmoid colon.                           Internal hemorrhoids were found during                            retroflexion. The hemorrhoids were small.                           The exam was otherwise without abnormality. Complications:            No immediate complications. Estimated blood loss:                            Minimal. Estimated Blood Loss:     Estimated blood loss was minimal. Impression:                - One diminutive polyp in the cecum, removed with a                            cold snare. Resected and retrieved.                           - One roughly 35 mm polyp in the cecum, removed                            piecemeal using a cold snare. Resected and                            retrieved.                           - Diverticulosis in the sigmoid colon.                           - Internal hemorrhoids.                           -  The examination was otherwise normal. Recommendation:           - Patient has a contact number available for                            emergencies. The signs and symptoms of potential                            delayed complications were discussed with the                            patient. Return to normal activities tomorrow.                            Written discharge instructions were provided to the                            patient.                           - Resume previous diet.                           - Continue present medications.                           - Await pathology results. Viviann Spare P. Celia Gibbons, MD 09/23/2023 9:04:06 AM This report has been signed electronically.

## 2023-09-23 NOTE — Patient Instructions (Addendum)
Resume previous diet Continue present medications Await pathology results Handouts/ information given for polyps, diverticulosis and hemorrhoids  YOU HAD AN ENDOSCOPIC PROCEDURE TODAY AT THE [facility]:   Refer to the procedure report that was given to you for any specific questions about what was found during the examination.  If the procedure report does not answer your questions, please call your gastroenterologist to clarify.  If you requested that your care partner not be given the details of your procedure findings, then the procedure report has been included in a sealed envelope for you to review at your convenience later.  YOU SHOULD EXPECT: Some feelings of bloating in the abdomen. Passage of more gas than usual.  Walking can help get rid of the air that was put into your GI tract during the procedure and reduce the bloating. If you had a lower endoscopy (such as a colonoscopy or flexible sigmoidoscopy) you may notice spotting of blood in your stool or on the toilet paper. If you underwent a bowel prep for your procedure, you may not have a normal bowel movement for a few days.  Please Note:  You might notice some irritation and congestion in your nose or some drainage.  This is from the oxygen used during your procedure.  There is no need for concern and it should clear up in a day or so.  SYMPTOMS TO REPORT IMMEDIATELY:  Following lower endoscopy (colonoscopy or flexible sigmoidoscopy):  Excessive amounts of blood in the stool  Significant tenderness or worsening of abdominal pains  Swelling of the abdomen that is new, acute  Fever of 100F or higher  For urgent or emergent issues, a gastroenterologist can be reached at any hour by calling [phone]. Do not use MyChart messaging for urgent concerns.    DIET:  We do recommend a small meal at first, but then you may proceed to your regular diet.  Drink plenty of fluids but you should avoid alcoholic beverages for 24  hours.  ACTIVITY:  You should plan to take it easy for the rest of today and you should NOT DRIVE or use heavy machinery until tomorrow (because of the sedation medicines used during the test).    FOLLOW UP: Our staff will call the number listed on your records the next business day following your procedure.  We will call around 7:15- 8:00 am to check on you and address any questions or concerns that you may have regarding the information given to you following your procedure. If we do not reach you, we will leave a message.     If any biopsies were taken you will be contacted by phone or by letter within the next 1-3 weeks.  Please call us at [phone] if you have not heard about the biopsies in 3 weeks.    SIGNATURES/CONFIDENTIALITY: You and/or your care partner have signed paperwork which will be entered into your electronic medical record.  These signatures attest to the fact that that the information above on your After Visit Summary has been reviewed and is understood.  Full responsibility of the confidentiality of this discharge information lies with you and/or your care-partner. 

## 2023-09-23 NOTE — Progress Notes (Signed)
Called to room to assist during endoscopic procedure.  Patient ID and intended procedure confirmed with present staff. Received instructions for my participation in the procedure from the performing physician.  

## 2023-09-23 NOTE — Progress Notes (Signed)
Pt's states no medical or surgical changes since previsit or office visit. 

## 2023-09-23 NOTE — Progress Notes (Signed)
[facility] History and Physical   Primary Care Physician:  Mliss Sax, MD   Reason for Procedure:   colon cancer screening  Plan:    colonoscopy     HPI: Henry Hernandez is a 57 y.o. male  here for colonoscopy screening  - grandmother and cousin both had colon cancer, same side of family.   Patient denies any bowel symptoms at this time. Otherwise feels well without any cardiopulmonary symptoms.   I have discussed risks / benefits of anesthesia and endoscopic procedure with Jens Som and they wish to proceed with the exams as outlined today.    Past Medical History:  Diagnosis Date   Allergy    Anxiety    Asthma    Chicken pox    Chronic kidney disease    Stage 3   Diabetes mellitus without complication (HCC)    Hyperlipidemia    Hypertension     Past Surgical History:  Procedure Laterality Date   BACK SURGERY     COLONOSCOPY      Prior to Admission medications   Medication Sig Start Date End Date Taking? Authorizing Provider  allopurinol (ZYLOPRIM) 100 MG tablet Take 2 tablets (200 mg total) by mouth daily. 05/23/23  Yes Mliss Sax, MD  amLODipine (NORVASC) 5 MG tablet Take 1 tablet (5 mg total) by mouth daily. 05/23/23  Yes Mliss Sax, MD  aspirin EC 81 MG tablet Take 1 tablet by mouth daily.   Yes [provider]  empagliflozin (JARDIANCE) 25 MG TABS tablet Take 1 tablet (25 mg total) by mouth daily before breakfast. 05/27/23  Yes Mliss Sax, MD  metFORMIN (GLUCOPHAGE-XR) 500 MG 24 hr tablet TAKE 1 TABLET NIGHTY PRIOR TO BED 07/13/23  Yes Mliss Sax, MD  metoprolol succinate (TOPROL-XL) 25 MG 24 hr tablet Take 1 tablet (25 mg total) by mouth daily. 05/23/23  Yes Mliss Sax, MD  olmesartan-hydrochlorothiazide (BENICAR HCT) 40-25 MG tablet Take 1 tablet by mouth daily. 05/23/23  Yes Mliss Sax, MD  Omega-3 Fatty Acids (FISH OIL BURP-LESS) 1000 MG CAPS Take 2,000 mg  by mouth daily. 04/09/22  Yes Mliss Sax, MD  rosuvastatin (CRESTOR) 20 MG tablet Take 1 tablet (20 mg total) by mouth daily. 05/23/23  Yes Mliss Sax, MD  vitamin B-12 (CYANOCOBALAMIN) 100 MCG tablet Take 100 mcg by mouth daily.   Yes [provider]  Vitamin D, Ergocalciferol, (DRISDOL) 1.25 MG (50000 UNIT) CAPS capsule Take 1 capsule (50,000 Units total) by mouth every 7 (seven) days. 01/23/23  Yes Mliss Sax, MD  colchicine 0.6 MG tablet Take 1 tablet (0.6 mg total) by mouth 2 (two) times daily. For 5-7 days as needed for gouty flairs. 08/02/22   Mliss Sax, MD  cyanocobalamin (VITAMIN B12) 1000 MCG/ML injection INJECT 1 ML (1,000 MCG TOTAL) INTO THE MUSCLE ONCE FOR 1 DOSE. 01/23/23   Mliss Sax, MD    Current Outpatient Medications  Medication Sig Dispense Refill   allopurinol (ZYLOPRIM) 100 MG tablet Take 2 tablets (200 mg total) by mouth daily. 180 tablet 2   amLODipine (NORVASC) 5 MG tablet Take 1 tablet (5 mg total) by mouth daily. 90 tablet 2   aspirin EC 81 MG tablet Take 1 tablet by mouth daily.     empagliflozin (JARDIANCE) 25 MG TABS tablet Take 1 tablet (25 mg total) by mouth daily before breakfast. 90 tablet 1   metFORMIN (GLUCOPHAGE-XR) 500 MG 24 hr  tablet TAKE 1 TABLET NIGHTY PRIOR TO BED 90 tablet 1   metoprolol succinate (TOPROL-XL) 25 MG 24 hr tablet Take 1 tablet (25 mg total) by mouth daily. 90 tablet 2   olmesartan-hydrochlorothiazide (BENICAR HCT) 40-25 MG tablet Take 1 tablet by mouth daily. 90 tablet 2   Omega-3 Fatty Acids (FISH OIL BURP-LESS) 1000 MG CAPS Take 2,000 mg by mouth daily. 180 capsule 2   rosuvastatin (CRESTOR) 20 MG tablet Take 1 tablet (20 mg total) by mouth daily. 90 tablet 3   vitamin B-12 (CYANOCOBALAMIN) 100 MCG tablet Take 100 mcg by mouth daily.     Vitamin D, Ergocalciferol, (DRISDOL) 1.25 MG (50000 UNIT) CAPS capsule Take 1 capsule (50,000 Units total) by mouth every 7 (seven) days.  12 capsule 2   colchicine 0.6 MG tablet Take 1 tablet (0.6 mg total) by mouth 2 (two) times daily. For 5-7 days as needed for gouty flairs. 30 tablet 3   cyanocobalamin (VITAMIN B12) 1000 MCG/ML injection INJECT 1 ML (1,000 MCG TOTAL) INTO THE MUSCLE ONCE FOR 1 DOSE. 3 mL 5   No current facility-administered medications for this visit.    Allergies as of 09/23/2023   (No Known Allergies)    Family History  Problem Relation Age of Onset   Arthritis Mother    COPD Mother    Diabetes Mother    Heart attack Mother    Heart disease Mother    Hyperlipidemia Mother    Hypertension Mother    Kidney disease Mother    Stroke Mother    Hypertension Father    Hypertension Sister    Hypertension Brother    Colon cancer Maternal Grandmother    Cancer Maternal Grandmother    Hypertension Maternal Grandmother    Colon cancer Cousin    Colon polyps Neg Hx    Esophageal cancer Neg Hx    Rectal cancer Neg Hx    Stomach cancer Neg Hx     Social History   Socioeconomic History   Marital status: Married    Spouse name: Not on file   Number of children: Not on file   Years of education: Not on file   Highest education level: Not on file  Occupational History   Not on file  Tobacco Use   Smoking status: Never   Smokeless tobacco: Never  Vaping Use   Vaping status: Never Used  Substance and Sexual Activity   Alcohol use: Yes    Comment: occ   Drug use: Never   Sexual activity: Not on file  Other Topics Concern   Not on file  Social History Narrative   Not on file   Social Determinants of Health   Financial Resource Strain: Not on file  Food Insecurity: Not on file  Transportation Needs: Not on file  Physical Activity: Not on file  Stress: Not on file  Social Connections: Unknown (05/14/2022)   Received from [facility]   Social Network    Social Network: Not on file  Intimate Partner Violence: Unknown (04/05/2022)   Received from [facility]   HITS    Physically Hurt: Not on file    Insult or Talk Down To: Not on file    Threaten Physical Harm: Not on file    Scream or Curse: Not on file    Review of Systems: All other review of systems negative except as mentioned in the HPI.  Physical Exam: Vital signs BP 107/63   Pulse  72   Temp 98.9 F (37.2 C)   Ht 5\' 10"  (1.778 m)   Wt 208 lb (94.3 kg)   SpO2 97%   BMI 29.84 kg/m   General:   Alert,  Well-developed, pleasant and cooperative in NAD Lungs:  Clear throughout to auscultation.   Heart:  Regular rate and rhythm Abdomen:  Soft, nontender and nondistended.   Neuro/Psych:  Alert and cooperative. Normal mood and affect. A and O x 3  Harlin Rain, MD [facility]

## 2023-09-24 ENCOUNTER — Telehealth: Payer: Self-pay | Admitting: *Deleted

## 2023-09-24 NOTE — Telephone Encounter (Signed)
  Follow up Call-     09/23/2023    8:00 AM  Call back number  Post procedure Call Back phone  # [phone]  Permission to leave phone message Yes     Patient questions:  Do you have a fever, pain , or abdominal swelling? No. Pain Score  0 *  Have you tolerated food without any problems? Yes.    Have you been able to return to your normal activities? Yes.    Do you have any questions about your discharge instructions: Diet   No. Medications  No. Follow up visit  No.  Do you have questions or concerns about your Care? No.  Actions: * If pain score is 4 or above: No action needed, pain <4.

## 2023-09-25 LAB — SURGICAL PATHOLOGY

## 2023-11-14 ENCOUNTER — Other Ambulatory Visit: Payer: Self-pay | Admitting: Family Medicine

## 2023-11-14 DIAGNOSIS — E1122 Type 2 diabetes mellitus with diabetic chronic kidney disease: Secondary | ICD-10-CM

## 2023-11-20 ENCOUNTER — Encounter: Payer: Self-pay | Admitting: Family Medicine

## 2023-11-20 ENCOUNTER — Ambulatory Visit (INDEPENDENT_AMBULATORY_CARE_PROVIDER_SITE_OTHER): Payer: No Typology Code available for payment source | Admitting: Family Medicine

## 2023-11-20 VITALS — BP 118/78 | HR 58 | Temp 98.0°F | Ht 70.0 in | Wt 213.2 lb

## 2023-11-20 DIAGNOSIS — E611 Iron deficiency: Secondary | ICD-10-CM | POA: Diagnosis not present

## 2023-11-20 DIAGNOSIS — E538 Deficiency of other specified B group vitamins: Secondary | ICD-10-CM | POA: Diagnosis not present

## 2023-11-20 DIAGNOSIS — E1122 Type 2 diabetes mellitus with diabetic chronic kidney disease: Secondary | ICD-10-CM

## 2023-11-20 DIAGNOSIS — R944 Abnormal results of kidney function studies: Secondary | ICD-10-CM | POA: Diagnosis not present

## 2023-11-20 DIAGNOSIS — N1831 Chronic kidney disease, stage 3a: Secondary | ICD-10-CM

## 2023-11-20 DIAGNOSIS — Z7984 Long term (current) use of oral hypoglycemic drugs: Secondary | ICD-10-CM

## 2023-11-20 LAB — BASIC METABOLIC PANEL
BUN: 32 mg/dL — ABNORMAL HIGH (ref 6–23)
CO2: 28 meq/L (ref 19–32)
Calcium: 9.6 mg/dL (ref 8.4–10.5)
Chloride: 101 meq/L (ref 96–112)
Creatinine, Ser: 1.94 mg/dL — ABNORMAL HIGH (ref 0.40–1.50)
GFR: 37.74 mL/min — ABNORMAL LOW (ref >60.00)
Glucose, Bld: 110 mg/dL — ABNORMAL HIGH (ref 70–99)
Potassium: 3.4 meq/L — ABNORMAL LOW (ref 3.5–5.1)
Sodium: 140 meq/L (ref 135–145)

## 2023-11-20 LAB — HEMOGLOBIN A1C: Hgb A1c MFr Bld: 7.6 % — ABNORMAL HIGH (ref 4.6–6.5)

## 2023-11-20 LAB — VITAMIN B12: Vitamin B-12: 288 pg/mL (ref 211–911)

## 2023-11-20 NOTE — Progress Notes (Signed)
Established Patient Office Visit   Subjective:  Patient ID: Henry Hernandez, male    DOB: [birth year]  Age: 58 y.o. MRN: [id number]  Chief Complaint  Patient presents with   Medical Management of Chronic Issues    3 month follow up. Pt is fasting. No concerns    HPI Encounter Diagnoses  Name Primary?   Type 2 diabetes mellitus with stage 3a chronic kidney disease, without long-term current use of insulin (HCC) Yes   B12 deficiency    Decreased GFR    Iron deficiency    For follow-up of above.  Since increasing his dose of empagliflozin for control of diabetes and decreased GFR.  Tolerating it well.  Only taking 100 mcg of B12.  Has had issue with iron deficiency in the past.  3.3 cm numbness polyp was removed from his cecum.  Noscapine in 6 months   Review of Systems  Constitutional: Negative.   HENT: Negative.    Eyes:  Negative for blurred vision, discharge and redness.  Respiratory: Negative.    Cardiovascular: Negative.   Gastrointestinal:  Negative for abdominal pain.  Genitourinary: Negative.   Musculoskeletal: Negative.  Negative for myalgias.  Skin:  Negative for rash.  Neurological:  Negative for tingling, loss of consciousness and weakness.  Endo/Heme/Allergies:  Negative for polydipsia.     Current Outpatient Medications:    allopurinol (ZYLOPRIM) 100 MG tablet, Take 2 tablets (200 mg total) by mouth daily., Disp: 180 tablet, Rfl: 2   amLODipine (NORVASC) 5 MG tablet, Take 1 tablet (5 mg total) by mouth daily., Disp: 90 tablet, Rfl: 2   aspirin EC 81 MG tablet, Take 1 tablet by mouth daily., Disp: , Rfl:    colchicine 0.6 MG tablet, Take 1 tablet (0.6 mg total) by mouth 2 (two) times daily. For 5-7 days as needed for gouty flairs., Disp: 30 tablet, Rfl: 3   cyanocobalamin (VITAMIN B12) 1000 MCG/ML injection, INJECT 1 ML (1,000 MCG TOTAL) INTO THE MUSCLE ONCE FOR 1 DOSE., Disp: 3 mL, Rfl: 5   empagliflozin (JARDIANCE) 25 MG TABS tablet, Take 1 tablet (25 mg total)  by mouth daily before breakfast., Disp: 90 tablet, Rfl: 1   metFORMIN (GLUCOPHAGE-XR) 500 MG 24 hr tablet, TAKE 1 TABLET NIGHTY PRIOR TO BED, Disp: 90 tablet, Rfl: 1   metoprolol succinate (TOPROL-XL) 25 MG 24 hr tablet, Take 1 tablet (25 mg total) by mouth daily., Disp: 90 tablet, Rfl: 2   olmesartan-hydrochlorothiazide (BENICAR HCT) 40-25 MG tablet, Take 1 tablet by mouth daily., Disp: 90 tablet, Rfl: 2   Omega-3 Fatty Acids (FISH OIL BURP-LESS) 1000 MG CAPS, Take 2,000 mg by mouth daily., Disp: 180 capsule, Rfl: 2   rosuvastatin (CRESTOR) 20 MG tablet, Take 1 tablet (20 mg total) by mouth daily., Disp: 90 tablet, Rfl: 3   vitamin B-12 (CYANOCOBALAMIN) 100 MCG tablet, Take 100 mcg by mouth daily., Disp: , Rfl:    Vitamin D, Ergocalciferol, (DRISDOL) 1.25 MG (50000 UNIT) CAPS capsule, Take 1 capsule (50,000 Units total) by mouth every 7 (seven) days., Disp: 12 capsule, Rfl: 2   Objective:     BP 118/78   Pulse (!) 58   Temp 98 F (36.7 C)   Ht 5\' 10"  (1.778 m)   Wt 213 lb 3.2 oz (96.7 kg)   SpO2 98%   BMI 30.59 kg/m    Physical Exam Constitutional:      General: He is not in acute distress.    Appearance: Normal appearance. He  is not ill-appearing, toxic-appearing or diaphoretic.  HENT:     Head: Normocephalic and atraumatic.     Right Ear: External ear normal.     Left Ear: External ear normal.  Eyes:     General: No scleral icterus.       Right eye: No discharge.        Left eye: No discharge.     Extraocular Movements: Extraocular movements intact.     Conjunctiva/sclera: Conjunctivae normal.  Pulmonary:     Effort: Pulmonary effort is normal. No respiratory distress.  Skin:    General: Skin is warm and dry.  Neurological:     Mental Status: He is alert and oriented to person, place, and time.  Psychiatric:        Mood and Affect: Mood normal.        Behavior: Behavior normal.      No results found for any visits on 11/20/23.    The 10-year ASCVD risk score  (Arnett DK, et al., 2019) is: 12.2%    Assessment & Plan:   Type 2 diabetes mellitus with stage 3a chronic kidney disease, without long-term current use of insulin (HCC) -     Hemoglobin A1c -     Basic metabolic panel  B12 deficiency -     Vitamin B12  Decreased GFR -     Basic metabolic panel  Iron deficiency -     Iron, TIBC and Ferritin Panel    Return in about 6 months (around 05/19/2024).  Increase B12 to 300 mcg daily.  Rechecking A1c and GFR.  Adjustments made labs.  Continue current medications at current doses  Mliss Sax, MD

## 2023-11-21 ENCOUNTER — Encounter: Payer: Self-pay | Admitting: Family Medicine

## 2023-11-21 DIAGNOSIS — N1832 Chronic kidney disease, stage 3b: Secondary | ICD-10-CM

## 2023-11-21 LAB — IRON,TIBC AND FERRITIN PANEL
%SAT: 26 % (ref 20–48)
Ferritin: 210 ng/mL (ref 38–380)
Iron: 88 ug/dL (ref 50–180)
TIBC: 344 ug/dL (ref 250–425)

## 2023-11-22 ENCOUNTER — Encounter: Payer: Self-pay | Admitting: Family Medicine

## 2023-11-24 ENCOUNTER — Other Ambulatory Visit: Payer: Self-pay | Admitting: Family Medicine

## 2023-11-24 DIAGNOSIS — E559 Vitamin D deficiency, unspecified: Secondary | ICD-10-CM

## 2023-11-24 DIAGNOSIS — E1122 Type 2 diabetes mellitus with diabetic chronic kidney disease: Secondary | ICD-10-CM

## 2023-11-25 ENCOUNTER — Other Ambulatory Visit: Payer: Self-pay | Admitting: Family

## 2023-11-25 MED ORDER — ATORVASTATIN CALCIUM 20 MG PO TABS: 20.0000 mg | ORAL_TABLET | Freq: Every day | ORAL | 3 refills | Status: DC

## 2024-01-12 ENCOUNTER — Other Ambulatory Visit: Payer: Self-pay | Admitting: Family Medicine

## 2024-01-12 DIAGNOSIS — R7303 Prediabetes: Secondary | ICD-10-CM

## 2024-04-04 ENCOUNTER — Other Ambulatory Visit: Payer: Self-pay | Admitting: Family Medicine

## 2024-04-04 DIAGNOSIS — Z8739 Personal history of other diseases of the musculoskeletal system and connective tissue: Secondary | ICD-10-CM

## 2024-04-04 DIAGNOSIS — I10 Essential (primary) hypertension: Secondary | ICD-10-CM

## 2024-04-12 ENCOUNTER — Encounter: Payer: Self-pay | Admitting: Gastroenterology

## 2024-05-01 ENCOUNTER — Other Ambulatory Visit: Payer: Self-pay | Admitting: Family Medicine

## 2024-05-01 DIAGNOSIS — N1831 Type 2 diabetes mellitus with diabetic chronic kidney disease: Secondary | ICD-10-CM

## 2024-05-13 ENCOUNTER — Other Ambulatory Visit: Payer: Self-pay

## 2024-05-18 ENCOUNTER — Ambulatory Visit: Payer: Self-pay | Admitting: Family Medicine

## 2024-05-18 ENCOUNTER — Encounter: Payer: Self-pay | Admitting: Family Medicine

## 2024-05-18 ENCOUNTER — Ambulatory Visit (INDEPENDENT_AMBULATORY_CARE_PROVIDER_SITE_OTHER): Admitting: Family Medicine

## 2024-05-18 VITALS — BP 124/70 | HR 58 | Temp 97.2°F | Ht 70.0 in | Wt 207.0 lb

## 2024-05-18 DIAGNOSIS — Z125 Encounter for screening for malignant neoplasm of prostate: Secondary | ICD-10-CM

## 2024-05-18 DIAGNOSIS — E611 Iron deficiency: Secondary | ICD-10-CM

## 2024-05-18 DIAGNOSIS — Z8739 Personal history of other diseases of the musculoskeletal system and connective tissue: Secondary | ICD-10-CM | POA: Diagnosis not present

## 2024-05-18 DIAGNOSIS — E7800 Pure hypercholesterolemia, unspecified: Secondary | ICD-10-CM | POA: Diagnosis not present

## 2024-05-18 DIAGNOSIS — E538 Deficiency of other specified B group vitamins: Secondary | ICD-10-CM | POA: Diagnosis not present

## 2024-05-18 DIAGNOSIS — E559 Vitamin D deficiency, unspecified: Secondary | ICD-10-CM | POA: Diagnosis not present

## 2024-05-18 DIAGNOSIS — N1831 Chronic kidney disease, stage 3a: Secondary | ICD-10-CM | POA: Diagnosis not present

## 2024-05-18 DIAGNOSIS — E1122 Type 2 diabetes mellitus with diabetic chronic kidney disease: Secondary | ICD-10-CM

## 2024-05-18 DIAGNOSIS — I10 Essential (primary) hypertension: Secondary | ICD-10-CM

## 2024-05-18 DIAGNOSIS — M545 Low back pain, unspecified: Secondary | ICD-10-CM

## 2024-05-18 DIAGNOSIS — Z0000 Encounter for general adult medical examination without abnormal findings: Secondary | ICD-10-CM

## 2024-05-18 DIAGNOSIS — G8929 Other chronic pain: Secondary | ICD-10-CM

## 2024-05-18 DIAGNOSIS — R944 Abnormal results of kidney function studies: Secondary | ICD-10-CM

## 2024-05-18 LAB — MICROALBUMIN / CREATININE URINE RATIO
Creatinine,U: 65.8 mg/dL
Microalb Creat Ratio: UNDETERMINED mg/g (ref 0.0–30.0)
Microalb, Ur: <0.7 mg/dL

## 2024-05-18 LAB — BASIC METABOLIC PANEL WITH GFR
BUN: 33 mg/dL — ABNORMAL HIGH (ref 6–23)
CO2: 28 meq/L (ref 19–32)
Calcium: 9.5 mg/dL (ref 8.4–10.5)
Chloride: 100 meq/L (ref 96–112)
Creatinine, Ser: 1.66 mg/dL — ABNORMAL HIGH (ref 0.40–1.50)
GFR: 45.35 mL/min — ABNORMAL LOW (ref >60.00)
Glucose, Bld: 119 mg/dL — ABNORMAL HIGH (ref 70–99)
Potassium: 4 meq/L (ref 3.5–5.1)
Sodium: 140 meq/L (ref 135–145)

## 2024-05-18 LAB — CBC WITH DIFFERENTIAL/PLATELET
Basophils Absolute: 0 10*3/uL (ref 0.0–0.1)
Basophils Relative: 0.9 % (ref 0.0–3.0)
Eosinophils Absolute: 0.1 10*3/uL (ref 0.0–0.7)
Eosinophils Relative: 2.9 % (ref 0.0–5.0)
HCT: 40.8 % (ref 39.0–52.0)
Hemoglobin: 13.9 g/dL (ref 13.0–17.0)
Lymphocytes Relative: 30.7 % (ref 12.0–46.0)
Lymphs Abs: 1.4 10*3/uL (ref 0.7–4.0)
MCHC: 34 g/dL (ref 30.0–36.0)
MCV: 86.5 fl (ref 78.0–100.0)
Monocytes Absolute: 0.3 10*3/uL (ref 0.1–1.0)
Monocytes Relative: 7.3 % (ref 3.0–12.0)
Neutro Abs: 2.6 10*3/uL (ref 1.4–7.7)
Neutrophils Relative %: 58.2 % (ref 43.0–77.0)
Platelets: 154 10*3/uL (ref 150.0–400.0)
RBC: 4.71 Mil/uL (ref 4.22–5.81)
RDW: 13.6 % (ref 11.5–15.5)
WBC: 4.4 10*3/uL (ref 4.0–10.5)

## 2024-05-18 LAB — LIPID PANEL
Cholesterol: 170 mg/dL (ref 0–200)
HDL: 39.1 mg/dL (ref >39.00)
LDL Cholesterol: 76 mg/dL (ref 0–99)
NonHDL: 130.71
Total CHOL/HDL Ratio: 4
Triglycerides: 275 mg/dL — ABNORMAL HIGH (ref 0.0–149.0)
VLDL: 55 mg/dL — ABNORMAL HIGH (ref 0.0–40.0)

## 2024-05-18 LAB — VITAMIN B12: Vitamin B-12: 321 pg/mL (ref 211–911)

## 2024-05-18 LAB — VITAMIN D 25 HYDROXY (VIT D DEFICIENCY, FRACTURES): VITD: 52.74 ng/mL (ref 30.00–100.00)

## 2024-05-18 LAB — PSA: PSA: 1.91 ng/mL (ref 0.10–4.00)

## 2024-05-18 LAB — HEMOGLOBIN A1C: Hgb A1c MFr Bld: 6.5 % (ref 4.6–6.5)

## 2024-05-18 LAB — URIC ACID: Uric Acid, Serum: 6.9 mg/dL (ref 4.0–7.8)

## 2024-05-18 MED ORDER — TRAMADOL HCL 50 MG PO TABS: 50.0000 mg | ORAL_TABLET | Freq: Two times a day (BID) | ORAL | 0 refills | Status: DC | PRN

## 2024-05-18 NOTE — Progress Notes (Signed)
 Established Patient Office Visit   Subjective:  Patient ID: Henry Hernandez, male    DOB: 1966-04-04  Age: 58 y.o. MRN: [id number]  Chief Complaint  Patient presents with   Medical Management of Chronic Issues    6 month follow up. Pt is fasting. Foot exam needed    HPI Encounter Diagnoses  Name Primary?   Healthcare maintenance Yes   Type 2 diabetes mellitus with stage 3a chronic kidney disease, without long-term current use of insulin  (HCC)    B12 deficiency    Decreased GFR    Iron deficiency    History of gout    Essential hypertension    Elevated cholesterol    Screening for prostate cancer    Chronic midline low back pain without sciatica    Vitamin D  deficiency    Here for physical and follow-up of above.  Now seeing nephrology every 6 months.  He is exercising regularly on his treadmill.  He is having issues with nonradiating lower back pain when he uses the treadmill at times.  He can no longer take nonsteroidals secondary to CKD 3B.  He has regular dental care.  He will be going to 30 hours a week at work in July   Review of Systems  Constitutional: Negative.   HENT: Negative.    Eyes:  Negative for blurred vision, discharge and redness.  Respiratory: Negative.    Cardiovascular: Negative.   Gastrointestinal:  Negative for abdominal pain.  Genitourinary: Negative.   Musculoskeletal:  Positive for back pain. Negative for myalgias.  Skin:  Negative for rash.  Neurological:  Negative for tingling, loss of consciousness and weakness.  Endo/Heme/Allergies:  Negative for polydipsia.      05/18/2024    9:29 AM 05/23/2023   10:10 AM 01/23/2023    8:32 AM  Depression screen PHQ 2/9  Decreased Interest 0 0 0  Down, Depressed, Hopeless 0 0 0  PHQ - 2 Score 0 0 0       Current Outpatient Medications:    allopurinol  (ZYLOPRIM ) 100 MG tablet, TAKE 2 TABLETS BY MOUTH EVERY DAY, Disp: 180 tablet, Rfl: 2   amLODipine  (NORVASC ) 5 MG tablet, TAKE 1 TABLET (5 MG TOTAL) BY  MOUTH DAILY., Disp: 90 tablet, Rfl: 2   aspirin EC 81 MG tablet, Take 1 tablet by mouth daily., Disp: , Rfl:    atorvastatin  (LIPITOR) 20 MG tablet, Take 1 tablet (20 mg total) by mouth daily., Disp: 90 tablet, Rfl: 3   colchicine  0.6 MG tablet, Take 1 tablet (0.6 mg total) by mouth 2 (two) times daily. For 5-7 days as needed for gouty flairs., Disp: 30 tablet, Rfl: 3   cyanocobalamin  (VITAMIN B12) 1000 MCG/ML injection, INJECT 1 ML (1,000 MCG TOTAL) INTO THE MUSCLE ONCE FOR 1 DOSE., Disp: 3 mL, Rfl: 5   JARDIANCE  25 MG TABS tablet, TAKE 1 TABLET BY MOUTH DAILY BEFORE BREAKFAST., Disp: 30 tablet, Rfl: 5   metFORMIN  (GLUCOPHAGE -XR) 500 MG 24 hr tablet, TAKE 1 TABLET NIGHTY PRIOR TO BED, Disp: 90 tablet, Rfl: 1   metoprolol  succinate (TOPROL -XL) 25 MG 24 hr tablet, TAKE 1 TABLET (25 MG TOTAL) BY MOUTH DAILY., Disp: 90 tablet, Rfl: 2   olmesartan -hydrochlorothiazide (BENICAR  HCT) 40-25 MG tablet, TAKE 1 TABLET BY MOUTH EVERY DAY, Disp: 90 tablet, Rfl: 2   Omega-3 Fatty Acids (FISH OIL  BURP-LESS) 1000 MG CAPS, Take 2,000 mg by mouth daily., Disp: 180 capsule, Rfl: 2   traMADol (ULTRAM) 50 MG tablet, Take 1  tablet (50 mg total) by mouth every 12 (twelve) hours as needed for up to 15 days., Disp: 30 tablet, Rfl: 0   vitamin B-12 (CYANOCOBALAMIN ) 100 MCG tablet, Take 100 mcg by mouth daily., Disp: , Rfl:    Vitamin D , Ergocalciferol , (DRISDOL ) 1.25 MG (50000 UNIT) CAPS capsule, TAKE 1 CAPSULE (50,000 UNITS TOTAL) BY MOUTH EVERY 7 (SEVEN) DAYS, Disp: 12 capsule, Rfl: 1   Objective:     BP 124/70 (Cuff Size: Normal)   Pulse (!) 58   Temp (!) 97.2 F (36.2 C) (Temporal)   Ht 5\' 10"  (1.778 m)   Wt 207 lb (93.9 kg)   SpO2 97%   BMI 29.70 kg/m    Physical Exam Constitutional:      General: He is not in acute distress.    Appearance: Normal appearance. He is not ill-appearing, toxic-appearing or diaphoretic.  HENT:     Head: Normocephalic and atraumatic.     Right Ear: Tympanic membrane, ear  canal and external ear normal.     Left Ear: Tympanic membrane, ear canal and external ear normal.     Mouth/Throat:     Mouth: Mucous membranes are moist.     Pharynx: Oropharynx is clear. No oropharyngeal exudate or posterior oropharyngeal erythema.  Eyes:     General: No scleral icterus.       Right eye: No discharge.        Left eye: No discharge.     Extraocular Movements: Extraocular movements intact.     Conjunctiva/sclera: Conjunctivae normal.     Pupils: Pupils are equal, round, and reactive to light.  Cardiovascular:     Rate and Rhythm: Normal rate and regular rhythm.  Pulmonary:     Effort: Pulmonary effort is normal. No respiratory distress.     Breath sounds: Normal breath sounds.  Abdominal:     General: Bowel sounds are normal.     Tenderness: There is no abdominal tenderness. There is no guarding.  Musculoskeletal:     Cervical back: No rigidity or tenderness.  Skin:    General: Skin is warm and dry.  Neurological:     Mental Status: He is alert and oriented to person, place, and time.  Psychiatric:        Mood and Affect: Mood normal.        Behavior: Behavior normal.    Diabetic Foot Exam - Simple   Simple Foot Form Diabetic Foot exam was performed with the following findings: Yes 05/18/2024  8:57 AM  Visual Inspection No deformities, no ulcerations, no other skin breakdown bilaterally: Yes Sensation Testing Intact to touch and monofilament testing bilaterally: Yes Pulse Check Posterior Tibialis and Dorsalis pulse intact bilaterally: Yes Comments       No results found for any visits on 05/18/24.    The 10-year ASCVD risk score (Arnett DK, et al., 2019) is: 15.4%    Assessment & Plan:   Healthcare maintenance  Type 2 diabetes mellitus with stage 3a chronic kidney disease, without long-term current use of insulin  (HCC) -     Microalbumin / creatinine urine ratio -     Basic metabolic panel with GFR -     Hemoglobin A1c  B12 deficiency -      Vitamin B12  Decreased GFR -     Basic metabolic panel with GFR  Iron deficiency -     Iron, TIBC and Ferritin Panel  History of gout -     Uric acid  Essential hypertension -  CBC with Differential/Platelet  Elevated cholesterol -     Lipid panel  Screening for prostate cancer -     PSA  Chronic midline low back pain without sciatica -     traMADol HCl; Take 1 tablet (50 mg total) by mouth every 12 (twelve) hours as needed for up to 15 days.  Dispense: 30 tablet; Refill: 0  Vitamin D  deficiency -     VITAMIN D  25 Hydroxy (Vit-D Deficiency, Fractures)    Return in about 6 months (around 11/18/2024).  Continue active and healthy lifestyle.  Continue follow-up with nephrology.  Continue all medications as above.  Information was given on health maintenance and disease prevention.  Tramadol to be used sparingly.  Tonna Frederic, MD

## 2024-05-19 LAB — IRON,TIBC AND FERRITIN PANEL
%SAT: 28 % (ref 20–48)
Ferritin: 116 ng/mL (ref 38–380)
Iron: 93 ug/dL (ref 50–180)
TIBC: 327 ug/dL (ref 250–425)

## 2024-05-28 ENCOUNTER — Ambulatory Visit

## 2024-05-28 ENCOUNTER — Encounter: Payer: Self-pay | Admitting: Gastroenterology

## 2024-05-28 VITALS — Ht 70.0 in | Wt 200.0 lb

## 2024-05-28 DIAGNOSIS — Z860100 Personal history of colon polyps, unspecified: Secondary | ICD-10-CM

## 2024-05-28 DIAGNOSIS — Z800 Family history of malignant neoplasm of digestive organs: Secondary | ICD-10-CM

## 2024-05-28 MED ORDER — NA SULFATE-K SULFATE-MG SULF 17.5-3.13-1.6 GM/177ML PO SOLN: 1.0000 | Freq: Once | ORAL | 0 refills | Status: AC

## 2024-05-28 NOTE — Progress Notes (Signed)
 Pre visit completed via phone call; Patient verified name, DOB, and address; No egg or soy allergy known to patient;  No issues known to pt with past sedation with any surgeries or procedures; Patient denies ever being told they had issues or difficulty with intubation;  No FH of Malignant Hyperthermia; Pt is not on diet pills; Pt is not on home 02;  Pt is not on blood thinners;  Pt denies issues with constipation;  No A fib or A flutter; Have any cardiac testing pending--NO Insurance verified during PV appt--- [employer] Pt can ambulate without assistance;  Pt denies use of chewing tobacco; Discussed diabetic/weight loss medication holds; Discussed NSAID holds; Checked BMI to be less than 50; Pt instructed to use [employer] or GoodRx for a price reduction on prep;  Patient's chart reviewed by Cathlyn Parsons CNRA prior to previsit and patient appropriate for the [facility];  Pre visit completed and red dot placed by patient's name on their procedure day (on provider's schedule);  Instructions sent to MyChart per patient request;

## 2024-06-10 ENCOUNTER — Encounter: Payer: Self-pay | Admitting: Gastroenterology

## 2024-06-10 ENCOUNTER — Ambulatory Visit: Admitting: Gastroenterology

## 2024-06-10 VITALS — BP 112/70 | HR 56 | Temp 98.0°F | Resp 15 | Ht 70.0 in | Wt 200.0 lb

## 2024-06-10 DIAGNOSIS — K648 Other hemorrhoids: Secondary | ICD-10-CM

## 2024-06-10 DIAGNOSIS — Z1211 Encounter for screening for malignant neoplasm of colon: Secondary | ICD-10-CM

## 2024-06-10 DIAGNOSIS — D120 Benign neoplasm of cecum: Secondary | ICD-10-CM

## 2024-06-10 DIAGNOSIS — K5730 Diverticulosis of large intestine without perforation or abscess without bleeding: Secondary | ICD-10-CM | POA: Diagnosis not present

## 2024-06-10 DIAGNOSIS — Z860100 Personal history of colon polyps, unspecified: Secondary | ICD-10-CM

## 2024-06-10 MED ORDER — SODIUM CHLORIDE 0.9 % IV SOLN: 500.0000 mL | Freq: Once | INTRAVENOUS | Status: DC

## 2024-06-10 NOTE — Progress Notes (Signed)
 Called to room to assist during endoscopic procedure.  Patient ID and intended procedure confirmed with present staff. Received instructions for my participation in the procedure from the performing physician.

## 2024-06-10 NOTE — Progress Notes (Signed)
 Report given to PACU, vss

## 2024-06-10 NOTE — Progress Notes (Signed)
 [facility] History and Physical   Primary Care Physician:  Tonna Frederic, MD   Reason for Procedure:   History of colon polyps  Plan:    colonoscopy     HPI: Henry Hernandez is a 58 y.o. male  here for colonoscopy surveillance - last exam 08/2023 with advanced adenoma removed in piecemeal 08/2023. He for close surveillance follow up given piecemeal polypectomy.  Patient denies any bowel symptoms at this time. Otherwise feels well without any cardiopulmonary symptoms.   I have discussed risks / benefits of anesthesia and endoscopic procedure with Henry Hernandez and they wish to proceed with the exams as outlined today.    Past Medical History:  Diagnosis Date   Allergy    Anxiety    Asthma    Chicken pox    Chronic kidney disease    Stage 3   Diabetes mellitus without complication (HCC)    Hyperlipidemia    Hypertension     Past Surgical History:  Procedure Laterality Date   BACK SURGERY     COLONOSCOPY  2024    Prior to Admission medications   Medication Sig Start Date End Date Taking? Authorizing Provider  allopurinol  (ZYLOPRIM ) 100 MG tablet TAKE 2 TABLETS BY MOUTH EVERY DAY 04/05/24  Yes Tonna Frederic, MD  amLODipine  (NORVASC ) 5 MG tablet TAKE 1 TABLET (5 MG TOTAL) BY MOUTH DAILY. 04/05/24  Yes Tonna Frederic, MD  aspirin EC 81 MG tablet Take 1 tablet by mouth daily.   Yes [provider]  atorvastatin  (LIPITOR) 20 MG tablet Take 1 tablet (20 mg total) by mouth daily. 11/25/23  Yes Jarold Merlin B, FNP  cyanocobalamin  (VITAMIN B12) 1000 MCG/ML injection INJECT 1 ML (1,000 MCG TOTAL) INTO THE MUSCLE ONCE FOR 1 DOSE. 01/23/23  Yes Tonna Frederic, MD  JARDIANCE  25 MG TABS tablet TAKE 1 TABLET BY MOUTH DAILY BEFORE BREAKFAST. 05/03/24  Yes Tonna Frederic, MD  metFORMIN  (GLUCOPHAGE -XR) 500 MG 24 hr tablet TAKE 1 TABLET NIGHTY PRIOR TO BED 01/12/24  Yes Tonna Frederic, MD  metoprolol  succinate (TOPROL -XL) 25 MG  24 hr tablet TAKE 1 TABLET (25 MG TOTAL) BY MOUTH DAILY. 04/05/24  Yes Tonna Frederic, MD  olmesartan -hydrochlorothiazide (BENICAR  HCT) 40-25 MG tablet TAKE 1 TABLET BY MOUTH EVERY DAY 04/05/24  Yes Tonna Frederic, MD  Omega-3 Fatty Acids (FISH OIL  BURP-LESS) 1000 MG CAPS Take 2,000 mg by mouth daily. 04/09/22  Yes Tonna Frederic, MD  vitamin B-12 (CYANOCOBALAMIN ) 100 MCG tablet Take 100 mcg by mouth daily.   Yes [provider]  Vitamin D , Ergocalciferol , (DRISDOL ) 1.25 MG (50000 UNIT) CAPS capsule TAKE 1 CAPSULE (50,000 UNITS TOTAL) BY MOUTH EVERY 7 (SEVEN) DAYS 11/24/23  Yes Tonna Frederic, MD  colchicine  0.6 MG tablet Take 1 tablet (0.6 mg total) by mouth 2 (two) times daily. For 5-7 days as needed for gouty flairs. 08/02/22   Tonna Frederic, MD    Current Outpatient Medications  Medication Sig Dispense Refill   allopurinol  (ZYLOPRIM ) 100 MG tablet TAKE 2 TABLETS BY MOUTH EVERY DAY 180 tablet 2   amLODipine  (NORVASC ) 5 MG tablet TAKE 1 TABLET (5 MG TOTAL) BY MOUTH DAILY. 90 tablet 2   aspirin EC 81 MG tablet Take 1 tablet by mouth daily.     atorvastatin  (LIPITOR) 20 MG tablet Take 1 tablet (20 mg total) by mouth daily. 90 tablet 3   cyanocobalamin  (VITAMIN B12) 1000 MCG/ML injection INJECT 1 ML (  1,000 MCG TOTAL) INTO THE MUSCLE ONCE FOR 1 DOSE. 3 mL 5   JARDIANCE  25 MG TABS tablet TAKE 1 TABLET BY MOUTH DAILY BEFORE BREAKFAST. 30 tablet 5   metFORMIN  (GLUCOPHAGE -XR) 500 MG 24 hr tablet TAKE 1 TABLET NIGHTY PRIOR TO BED 90 tablet 1   metoprolol  succinate (TOPROL -XL) 25 MG 24 hr tablet TAKE 1 TABLET (25 MG TOTAL) BY MOUTH DAILY. 90 tablet 2   olmesartan -hydrochlorothiazide (BENICAR  HCT) 40-25 MG tablet TAKE 1 TABLET BY MOUTH EVERY DAY 90 tablet 2   Omega-3 Fatty Acids (FISH OIL  BURP-LESS) 1000 MG CAPS Take 2,000 mg by mouth daily. 180 capsule 2   vitamin B-12 (CYANOCOBALAMIN ) 100 MCG tablet Take 100 mcg by mouth daily.     Vitamin D , Ergocalciferol ,  (DRISDOL ) 1.25 MG (50000 UNIT) CAPS capsule TAKE 1 CAPSULE (50,000 UNITS TOTAL) BY MOUTH EVERY 7 (SEVEN) DAYS 12 capsule 1   colchicine  0.6 MG tablet Take 1 tablet (0.6 mg total) by mouth 2 (two) times daily. For 5-7 days as needed for gouty flairs. 30 tablet 3   Current Facility-Administered Medications  Medication Dose Route Frequency Provider Last Rate Last Admin   0.9 %  sodium chloride infusion  500 mL Intravenous Once [name], Lendon Queen, MD        Allergies as of 06/10/2024   (No Known Allergies)    Family History  Problem Relation Age of Onset   Arthritis Mother    COPD Mother    Diabetes Mother    Heart attack Mother    Heart disease Mother    Hyperlipidemia Mother    Hypertension Mother    Kidney disease Mother    Stroke Mother    Hypertension Father    Hypertension Sister    Hypertension Brother    Colon cancer Maternal Grandmother    Cancer Maternal Grandmother    Hypertension Maternal Grandmother    Colon cancer Cousin    Colon polyps Neg Hx    Esophageal cancer Neg Hx    Rectal cancer Neg Hx    Stomach cancer Neg Hx     Social History   Socioeconomic History   Marital status: Married    Spouse name: Not on file   Number of children: Not on file   Years of education: Not on file   Highest education level: Not on file  Occupational History   Not on file  Tobacco Use   Smoking status: Never   Smokeless tobacco: Never  Vaping Use   Vaping status: Never Used  Substance and Sexual Activity   Alcohol use: Yes    Comment: occ   Drug use: Never   Sexual activity: Not on file  Other Topics Concern   Not on file  Social History Narrative   Not on file   Social Drivers of Health   Financial Resource Strain: Low Risk  (02/01/2024)   Received from [facility]   Overall Financial Resource Strain (CARDIA)    Difficulty of Paying Living Expenses: Not hard at all  Food Insecurity: No Food Insecurity (02/01/2024)   Received from [facility]   Hunger  Vital Sign    Worried About Running Out of Food in the Last Year: Never true    Ran Out of Food in the Last Year: Never true  Transportation Needs: No Transportation Needs (02/01/2024)   Received from [facility] - Transportation    Lack of Transportation (Medical): No    Lack of Transportation (Non-Medical): No  Physical Activity: Insufficiently Active (02/01/2024)   Received from [facility]   Exercise Vital Sign    Days of Exercise per Week: 2 days    Minutes of Exercise per Session: 60 min  Stress: Stress Concern Present (02/01/2024)   Received from [facility] - Occupational Stress Questionnaire    Feeling of Stress : To some extent  Social Connections: Moderately Integrated (02/01/2024)   Received from [facility]   Social Network    How would you rate your social network (family, work, friends)?: Adequate participation with social networks  Intimate Partner Violence: Not At Risk (02/01/2024)   Received from [facility]   HITS    Over the last 12 months how often did your partner physically hurt you?: Never    Over the last 12 months how often did your partner insult you or talk down to you?: Never    Over the last 12 months how often did your partner threaten you with physical harm?: Never    Over the last 12 months how often did your partner scream or curse at you?: Never    Review of Systems: All other review of systems negative except as mentioned in the HPI.  Physical Exam: Vital signs BP (!) 148/86   Pulse 65   Temp 98 F (36.7 C) (Temporal)   Resp 12   Ht 5' 10 (1.778 m)   Wt 200 lb (90.7 kg)   SpO2 96%   BMI 28.70 kg/m   General:   Alert,  Well-developed, pleasant and cooperative in NAD Lungs:  Clear throughout to auscultation.   Heart:  Regular rate and rhythm Abdomen:  Soft, nontender and nondistended.   Neuro/Psych:  Alert and cooperative. Normal mood and affect. A and O x 3  Christi Coward,  MD [facility]

## 2024-06-10 NOTE — Progress Notes (Signed)
 Pt's states no medical or surgical changes since previsit or office visit.

## 2024-06-10 NOTE — Patient Instructions (Signed)
 Please read handouts provided. Continue present medications. Await pathology results. Resume previous diet. Repeat colonoscopy in 3 years for screening.   YOU HAD AN ENDOSCOPIC PROCEDURE TODAY AT THE [facility]:   Refer to the procedure report that was given to you for any specific questions about what was found during the examination.  If the procedure report does not answer your questions, please call your gastroenterologist to clarify.  If you requested that your care partner not be given the details of your procedure findings, then the procedure report has been included in a sealed envelope for you to review at your convenience later.  YOU SHOULD EXPECT: Some feelings of bloating in the abdomen. Passage of more gas than usual.  Walking can help get rid of the air that was put into your GI tract during the procedure and reduce the bloating. If you had a lower endoscopy (such as a colonoscopy or flexible sigmoidoscopy) you may notice spotting of blood in your stool or on the toilet paper. If you underwent a bowel prep for your procedure, you may not have a normal bowel movement for a few days.  Please Note:  You might notice some irritation and congestion in your nose or some drainage.  This is from the oxygen used during your procedure.  There is no need for concern and it should clear up in a day or so.  SYMPTOMS TO REPORT IMMEDIATELY:  Following lower endoscopy (colonoscopy or flexible sigmoidoscopy):  Excessive amounts of blood in the stool  Significant tenderness or worsening of abdominal pains  Swelling of the abdomen that is new, acute  Fever of 100F or higher.  For urgent or emergent issues, a gastroenterologist can be reached at any hour by calling [phone]. Do not use MyChart messaging for urgent concerns.    DIET:  We do recommend a small meal at first, but then you may proceed to your regular diet.  Drink plenty of fluids but you should avoid alcoholic  beverages for 24 hours.  ACTIVITY:  You should plan to take it easy for the rest of today and you should NOT DRIVE or use heavy machinery until tomorrow (because of the sedation medicines used during the test).    FOLLOW UP: Our staff will call the number listed on your records the next business day following your procedure.  We will call around 7:15- 8:00 am to check on you and address any questions or concerns that you may have regarding the information given to you following your procedure. If we do not reach you, we will leave a message.     If any biopsies were taken you will be contacted by phone or by letter within the next 1-3 weeks.  Please call us at [phone] if you have not heard about the biopsies in 3 weeks.    SIGNATURES/CONFIDENTIALITY: You and/or your care partner have signed paperwork which will be entered into your electronic medical record.  These signatures attest to the fact that that the information above on your After Visit Summary has been reviewed and is understood.  Full responsibility of the confidentiality of this discharge information lies with you and/or your care-partner.

## 2024-06-10 NOTE — Op Note (Signed)
  [facility] Patient Name: Henry Hernandez Procedure Date: 06/10/2024 9:28 AM MRN: [id number] Endoscopist: Landon Pinion P. General [place] , MD, [id number] Age: 58 Referring MD:  Date of Birth: [birth year] Gender: Male Account #: [id number] Procedure:                Colonoscopy Indications:              High risk colon cancer surveillance: Personal                            history of colonic polyps - last exam 08/2023 - 35mm                            adenoma removed in piecemeal fashion from the                            cecum, here for close surveillance follow up Medicines:                Monitored Anesthesia Care Procedure:                Pre-Anesthesia Assessment:                           - Prior to the procedure, a History and Physical                            was performed, and patient medications and                            allergies were reviewed. The patient's tolerance of                            previous anesthesia was also reviewed. The risks                            and benefits of the procedure and the sedation                            options and risks were discussed with the patient.                            All questions were answered, and informed consent                            was obtained. Prior Anticoagulants: The patient has                            taken no anticoagulant or antiplatelet agents. ASA                            Grade Assessment: II - A patient with mild systemic                            disease. After reviewing the risks and benefits,  the patient was deemed in satisfactory condition to                            undergo the procedure.                           After obtaining informed consent, the colonoscope                            was passed under direct vision. Throughout the                            procedure, the patient's blood pressure, pulse, and                            oxygen saturations  were monitored continuously. The                            CF [id number] #[id number] was introduced through the anus                            and advanced to the the cecum, identified by                            appendiceal orifice and ileocecal valve. The                            colonoscopy was performed without difficulty. The                            patient tolerated the procedure well. The quality                            of the bowel preparation was good. The ileocecal                            valve, appendiceal orifice, and rectum were                            photographed. Scope In: 9:41:50 AM Scope Out: 10:00:55 AM Scope Withdrawal Time: 0 hours 15 minutes 1 second  Total Procedure Duration: 0 hours 19 minutes 5 seconds  Findings:                 The perianal and digital rectal examinations were                            normal.                           A large post polypectomy scar was found in the                            cecum. There was a small amount of residual polyp  tissue seen along the back side of a fold as below.                           A 4 to 5 mm polyp was found in the cecum along the                            back side of a fold along large polypectomy scar.                            The polyp was sessile. The polyp was removed with a                            cold snare. Resection and retrieval were complete,                            however the back side of the fold was hard to grasp                            with the snare. To ensure a good margin in this                            area, cold forceps avulsion was used.                           A few small-mouthed diverticula were found in the                            sigmoid colon.                           Internal hemorrhoids were found during retroflexion.                           The exam was otherwise without abnormality. Complications:            No  immediate complications. Estimated blood loss:                            Minimal. Estimated Blood Loss:     Estimated blood loss was minimal. Impression:               - Post-polypectomy scar in the cecum.                           - One 4 to 5 mm polyp in the cecum at the                            polypectomy site, removed with a cold snare / cold                            forceps avulsion. Resected and retrieved. Biopsied.                           -  Diverticulosis in the sigmoid colon.                           - Internal hemorrhoids.                           - The examination was otherwise normal. Recommendation:           - Patient has a contact number available for                            emergencies. The signs and symptoms of potential                            delayed complications were discussed with the                            patient. Return to normal activities tomorrow.                            Written discharge instructions were provided to the                            patient.                           - Resume previous diet.                           - Await pathology results.                           - Repeat colonoscopy in 3 years for surveillance. Landon Pinion P. [place], MD 06/10/2024 10:08:12 AM This report has been signed electronically.

## 2024-06-11 ENCOUNTER — Telehealth: Payer: Self-pay | Admitting: *Deleted

## 2024-06-11 NOTE — Telephone Encounter (Signed)
 Post procedure follow up call placed, no answer and left VM.

## 2024-06-14 LAB — SURGICAL PATHOLOGY

## 2024-06-15 ENCOUNTER — Ambulatory Visit: Payer: Self-pay | Admitting: Gastroenterology

## 2024-06-20 ENCOUNTER — Other Ambulatory Visit: Payer: Self-pay | Admitting: Family Medicine

## 2024-06-20 DIAGNOSIS — E559 Vitamin D deficiency, unspecified: Secondary | ICD-10-CM

## 2024-06-22 ENCOUNTER — Other Ambulatory Visit: Payer: Self-pay | Admitting: Family Medicine

## 2024-06-22 DIAGNOSIS — R7303 Prediabetes: Secondary | ICD-10-CM

## 2024-07-16 ENCOUNTER — Other Ambulatory Visit: Payer: Self-pay | Admitting: Family Medicine

## 2024-07-16 DIAGNOSIS — M545 Low back pain, unspecified: Secondary | ICD-10-CM

## 2024-09-15 ENCOUNTER — Other Ambulatory Visit: Payer: Self-pay | Admitting: Family Medicine

## 2024-09-15 DIAGNOSIS — M545 Low back pain, unspecified: Secondary | ICD-10-CM

## 2024-09-24 ENCOUNTER — Encounter: Payer: Self-pay | Admitting: Gastroenterology

## 2024-11-02 ENCOUNTER — Encounter: Payer: Self-pay | Admitting: Family Medicine

## 2024-11-02 ENCOUNTER — Ambulatory Visit: Admitting: Family Medicine

## 2024-11-02 VITALS — BP 116/76 | HR 64 | Temp 98.1°F | Ht 70.0 in | Wt 213.0 lb

## 2024-11-02 DIAGNOSIS — N401 Enlarged prostate with lower urinary tract symptoms: Secondary | ICD-10-CM | POA: Diagnosis not present

## 2024-11-02 DIAGNOSIS — E1122 Type 2 diabetes mellitus with diabetic chronic kidney disease: Secondary | ICD-10-CM

## 2024-11-02 DIAGNOSIS — E7800 Pure hypercholesterolemia, unspecified: Secondary | ICD-10-CM | POA: Diagnosis not present

## 2024-11-02 DIAGNOSIS — Z7984 Long term (current) use of oral hypoglycemic drugs: Secondary | ICD-10-CM

## 2024-11-02 DIAGNOSIS — N1831 Chronic kidney disease, stage 3a: Secondary | ICD-10-CM

## 2024-11-02 DIAGNOSIS — R350 Frequency of micturition: Secondary | ICD-10-CM

## 2024-11-02 LAB — COMPREHENSIVE METABOLIC PANEL WITH GFR
ALT: 45 U/L (ref 0–53)
AST: 22 U/L (ref 0–37)
Albumin: 4.7 g/dL (ref 3.5–5.2)
Alkaline Phosphatase: 70 U/L (ref 39–117)
BUN: 28 mg/dL — ABNORMAL HIGH (ref 6–23)
CO2: 30 meq/L (ref 19–32)
Calcium: 9.5 mg/dL (ref 8.4–10.5)
Chloride: 101 meq/L (ref 96–112)
Creatinine, Ser: 1.71 mg/dL — ABNORMAL HIGH (ref 0.40–1.50)
GFR: 43.62 mL/min — ABNORMAL LOW (ref >60.00)
Glucose, Bld: 117 mg/dL — ABNORMAL HIGH (ref 70–99)
Potassium: 3.4 meq/L — ABNORMAL LOW (ref 3.5–5.1)
Sodium: 140 meq/L (ref 135–145)
Total Bilirubin: 0.6 mg/dL (ref 0.2–1.2)
Total Protein: 7.7 g/dL (ref 6.0–8.3)

## 2024-11-02 LAB — LIPID PANEL
Cholesterol: 160 mg/dL (ref 0–200)
HDL: 36 mg/dL — ABNORMAL LOW (ref >39.00)
LDL Cholesterol: 70 mg/dL (ref 0–99)
NonHDL: 123.89
Total CHOL/HDL Ratio: 4
Triglycerides: 270 mg/dL — ABNORMAL HIGH (ref 0.0–149.0)
VLDL: 54 mg/dL — ABNORMAL HIGH (ref 0.0–40.0)

## 2024-11-02 LAB — HEMOGLOBIN A1C: Hgb A1c MFr Bld: 7 % — ABNORMAL HIGH (ref 4.6–6.5)

## 2024-11-02 MED ORDER — ATORVASTATIN CALCIUM 20 MG PO TABS: 20.0000 mg | ORAL_TABLET | Freq: Every day | ORAL | 3 refills | Status: AC

## 2024-11-02 MED ORDER — METFORMIN HCL ER 500 MG PO TB24: ORAL_TABLET | ORAL | 1 refills | Status: AC

## 2024-11-02 MED ORDER — TAMSULOSIN HCL 0.4 MG PO CAPS: 0.4000 mg | ORAL_CAPSULE | Freq: Every day | ORAL | 3 refills | Status: AC

## 2024-11-02 MED ORDER — EMPAGLIFLOZIN 25 MG PO TABS: 25.0000 mg | ORAL_TABLET | Freq: Every day | ORAL | 5 refills | Status: AC

## 2024-11-02 NOTE — Progress Notes (Signed)
 Established Patient Office Visit   Subjective:  Patient ID: Henry Hernandez, male    DOB: 13-Apr-1966  Age: 58 y.o. MRN: [id number]  Chief Complaint  Patient presents with   Medical Management of Chronic Issues    6 months follow up. Pt is fasting. Refused prevnar. No concerns    HPI Encounter Diagnoses  Name Primary?   Type 2 diabetes mellitus with stage 3a chronic kidney disease, without long-term current use of insulin  (HCC) Yes   Benign prostatic hyperplasia with urinary frequency    Elevated cholesterol    Doing well.  Plans on retiring next month.  Well-organized to do so.  Consultation with nephrology was encouraging.  No nonsteroidals.  Reports BPH symptoms with decreased force of stream, urinary frequency and nocturia.   Review of Systems  Constitutional: Negative.   HENT: Negative.    Eyes:  Negative for blurred vision, discharge and redness.  Respiratory: Negative.    Cardiovascular: Negative.   Gastrointestinal:  Negative for abdominal pain.  Genitourinary: Negative.   Musculoskeletal: Negative.  Negative for myalgias.  Skin:  Negative for rash.  Neurological:  Negative for tingling, loss of consciousness and weakness.  Endo/Heme/Allergies:  Negative for polydipsia.     Current Outpatient Medications:    allopurinol  (ZYLOPRIM ) 100 MG tablet, TAKE 2 TABLETS BY MOUTH EVERY DAY, Disp: 180 tablet, Rfl: 2   amLODipine  (NORVASC ) 5 MG tablet, TAKE 1 TABLET (5 MG TOTAL) BY MOUTH DAILY., Disp: 90 tablet, Rfl: 2   aspirin EC 81 MG tablet, Take 1 tablet by mouth daily., Disp: , Rfl:    colchicine  0.6 MG tablet, Take 1 tablet (0.6 mg total) by mouth 2 (two) times daily. For 5-7 days as needed for gouty flairs., Disp: 30 tablet, Rfl: 3   cyanocobalamin  (VITAMIN B12) 1000 MCG/ML injection, INJECT 1 ML (1,000 MCG TOTAL) INTO THE MUSCLE ONCE FOR 1 DOSE., Disp: 3 mL, Rfl: 5   metoprolol  succinate (TOPROL -XL) 25 MG 24 hr tablet, TAKE 1 TABLET (25 MG TOTAL) BY MOUTH DAILY., Disp: 90  tablet, Rfl: 2   olmesartan -hydrochlorothiazide (BENICAR  HCT) 40-25 MG tablet, TAKE 1 TABLET BY MOUTH EVERY DAY, Disp: 90 tablet, Rfl: 2   Omega-3 Fatty Acids (FISH OIL  BURP-LESS) 1000 MG CAPS, Take 2,000 mg by mouth daily., Disp: 180 capsule, Rfl: 2   tamsulosin (FLOMAX) 0.4 MG CAPS capsule, Take 1 capsule (0.4 mg total) by mouth daily., Disp: 30 capsule, Rfl: 3   vitamin B-12 (CYANOCOBALAMIN ) 100 MCG tablet, Take 100 mcg by mouth daily., Disp: , Rfl:    atorvastatin  (LIPITOR) 20 MG tablet, Take 1 tablet (20 mg total) by mouth daily., Disp: 90 tablet, Rfl: 3   empagliflozin  (JARDIANCE ) 25 MG TABS tablet, Take 1 tablet (25 mg total) by mouth daily before breakfast., Disp: 30 tablet, Rfl: 5   metFORMIN  (GLUCOPHAGE -XR) 500 MG 24 hr tablet, TAKE 1 TABLET NIGHTY PRIOR TO BED, Disp: 90 tablet, Rfl: 1   Objective:     BP 116/76 (BP Location: Right Arm, Patient Position: Sitting, Cuff Size: Normal)   Pulse 64   Temp 98.1 F (36.7 C) (Temporal)   Ht 5' 10 (1.778 m)   Wt 213 lb (96.6 kg)   SpO2 98%   BMI 30.56 kg/m    Physical Exam Constitutional:      General: He is not in acute distress.    Appearance: Normal appearance. He is not ill-appearing, toxic-appearing or diaphoretic.  HENT:     Head: Normocephalic and atraumatic.  Right Ear: External ear normal.     Left Ear: External ear normal.  Eyes:     General: No scleral icterus.       Right eye: No discharge.        Left eye: No discharge.     Extraocular Movements: Extraocular movements intact.     Conjunctiva/sclera: Conjunctivae normal.  Pulmonary:     Effort: Pulmonary effort is normal. No respiratory distress.  Skin:    General: Skin is warm and dry.  Neurological:     Mental Status: He is alert and oriented to person, place, and time.  Psychiatric:        Mood and Affect: Mood normal.        Behavior: Behavior normal.      No results found for any visits on 11/02/24.    The 10-year ASCVD risk score (Arnett DK,  et al., 2019) is: 13.8%    Assessment & Plan:   Type 2 diabetes mellitus with stage 3a chronic kidney disease, without long-term current use of insulin  (HCC) -     Empagliflozin ; Take 1 tablet (25 mg total) by mouth daily before breakfast.  Dispense: 30 tablet; Refill: 5 -     metFORMIN  HCl ER; TAKE 1 TABLET NIGHTY PRIOR TO BED  Dispense: 90 tablet; Refill: 1 -     Hemoglobin A1c -     Comprehensive metabolic panel with GFR  Benign prostatic hyperplasia with urinary frequency -     Tamsulosin HCl; Take 1 capsule (0.4 mg total) by mouth daily.  Dispense: 30 capsule; Refill: 3  Elevated cholesterol -     Atorvastatin  Calcium ; Take 1 tablet (20 mg total) by mouth daily.  Dispense: 90 tablet; Refill: 3 -     Lipid panel    Return in about 6 months (around 05/02/2025).  Vitamin D  well replaced.  Will start at 1000 IUs in place of the 50,000 weekly tablet.  Will start tamsulosin 0.4 mg daily for BPH symptoms.  Will try for few months and let me know if he would like to continue it.  Elsie Sim Lent, MD

## 2024-11-04 ENCOUNTER — Other Ambulatory Visit: Payer: Self-pay | Admitting: Family Medicine

## 2024-11-04 ENCOUNTER — Ambulatory Visit: Payer: Self-pay | Admitting: Family Medicine

## 2024-11-04 DIAGNOSIS — G8929 Other chronic pain: Secondary | ICD-10-CM

## 2024-11-05 NOTE — Telephone Encounter (Signed)
 Requesting: TRAMADOL  HCL 50 MG TABLET Last Visit: 11/02/2024 Next Visit: Visit date not found Last Refill: 07/19/2024  Please Advise

## 2024-11-21 ENCOUNTER — Other Ambulatory Visit: Payer: Self-pay | Admitting: Family Medicine

## 2024-11-21 DIAGNOSIS — N1831 Chronic kidney disease, stage 3a: Secondary | ICD-10-CM

## 2024-11-26 ENCOUNTER — Other Ambulatory Visit: Payer: Self-pay | Admitting: Family Medicine

## 2024-11-26 DIAGNOSIS — I10 Essential (primary) hypertension: Secondary | ICD-10-CM

## 2024-11-26 DIAGNOSIS — Z8739 Personal history of other diseases of the musculoskeletal system and connective tissue: Secondary | ICD-10-CM

## 2024-12-09 LAB — OPHTHALMOLOGY REPORT-SCANNED

## 2024-12-28 ENCOUNTER — Other Ambulatory Visit: Payer: Self-pay | Admitting: Family Medicine

## 2024-12-28 DIAGNOSIS — M545 Low back pain, unspecified: Secondary | ICD-10-CM
